# Patient Record
Sex: Female | Born: 1980 | Race: White | Hispanic: No | Marital: Single | Smoking: Never smoker
Health system: Southern US, Community
[De-identification: ages and names within clinical notes are randomized; demographics above are authoritative.]

## PROBLEM LIST (undated history)

## (undated) DIAGNOSIS — O24419 Gestational diabetes mellitus in pregnancy, unspecified control: Secondary | ICD-10-CM

## (undated) HISTORY — PX: BREAST REDUCTION SURGERY: SHX8

## (undated) HISTORY — PX: HAND SURGERY: SHX662

## (undated) HISTORY — PX: KNEE SURGERY: SHX244

---

## 2014-07-31 DIAGNOSIS — Z3009 Encounter for other general counseling and advice on contraception: Secondary | ICD-10-CM | POA: Diagnosis not present

## 2014-07-31 DIAGNOSIS — Z124 Encounter for screening for malignant neoplasm of cervix: Secondary | ICD-10-CM | POA: Diagnosis not present

## 2014-07-31 DIAGNOSIS — Z01419 Encounter for gynecological examination (general) (routine) without abnormal findings: Secondary | ICD-10-CM | POA: Diagnosis not present

## 2014-07-31 DIAGNOSIS — Z3041 Encounter for surveillance of contraceptive pills: Secondary | ICD-10-CM | POA: Diagnosis not present

## 2015-01-13 DIAGNOSIS — Z3009 Encounter for other general counseling and advice on contraception: Secondary | ICD-10-CM | POA: Diagnosis not present

## 2015-01-13 DIAGNOSIS — Z3041 Encounter for surveillance of contraceptive pills: Secondary | ICD-10-CM | POA: Diagnosis not present

## 2015-01-13 DIAGNOSIS — Z124 Encounter for screening for malignant neoplasm of cervix: Secondary | ICD-10-CM | POA: Diagnosis not present

## 2015-01-13 DIAGNOSIS — Z01419 Encounter for gynecological examination (general) (routine) without abnormal findings: Secondary | ICD-10-CM | POA: Diagnosis not present

## 2015-04-27 DIAGNOSIS — N390 Urinary tract infection, site not specified: Secondary | ICD-10-CM | POA: Diagnosis not present

## 2015-09-30 DIAGNOSIS — Z01419 Encounter for gynecological examination (general) (routine) without abnormal findings: Secondary | ICD-10-CM | POA: Diagnosis not present

## 2015-09-30 DIAGNOSIS — Z124 Encounter for screening for malignant neoplasm of cervix: Secondary | ICD-10-CM | POA: Diagnosis not present

## 2016-05-17 NOTE — L&D Delivery Note (Signed)
Delivery Note 36 y.o G4P4004 at 7314w4d with gDM induced with Cytotec, FB, and pitocin for preeclampsia on MgSO4.   At 5:11 PM a viable female was delivered via Vaginal, Spontaneous (Presentation: vertex, OA with rotation to LOA).  APGAR: 7, 9; weight: pending  .   Placenta status: intact, schultz Cord: 3 vessels  with the following complications: none .  Cord pH: n/a  Anesthesia:  Epidural Episiotomy: None Lacerations: None Suture Repair: n/a Est. Blood Loss (mL):  150mL   Mom to postpartum.  Baby to Couplet care / Skin to Skin.  Felisa BonierBianca Seivley PA Student 05/03/2017, 5:30 PM   I attest that I was present and gloved at the bedside and I agree with the above documentation and findings.   Please schedule this patient for Postpartum visit in: 1 week with the following provider: RN and in 4-6 weeks for a PP visit. Patient needs two hour GTT at her pp visit.  For C/S patients schedule nurse incision check in weeks 2 weeks: no High risk pregnancy complicated by: GDM and pre-e with severe features.  Delivery mode:  SD Anticipated Birth Control:  IUD PP Procedures needed: 2 hour GTT  Schedule Integrated BH visit: no

## 2016-08-11 ENCOUNTER — Emergency Department (INDEPENDENT_AMBULATORY_CARE_PROVIDER_SITE_OTHER)
Admission: EM | Admit: 2016-08-11 | Discharge: 2016-08-11 | Disposition: A | Discharge status: Home / Self Care | Payer: Medicare Other | Source: Home / Self Care | Attending: Family Medicine | Admitting: Family Medicine

## 2016-08-11 ENCOUNTER — Encounter: Payer: Self-pay | Admitting: Emergency Medicine

## 2016-08-11 DIAGNOSIS — L03115 Cellulitis of right lower limb: Secondary | ICD-10-CM | POA: Diagnosis not present

## 2016-08-11 MED ORDER — CLINDAMYCIN HCL 300 MG PO CAPS: 300.0000 mg | ORAL_CAPSULE | Freq: Four times a day (QID) | ORAL | 0 refills | Status: DC

## 2016-08-11 MED ORDER — HYDROCODONE-ACETAMINOPHEN 5-325 MG PO TABS: 1.0000 | ORAL_TABLET | Freq: Four times a day (QID) | ORAL | 0 refills | Status: DC | PRN

## 2016-08-11 NOTE — Discharge Instructions (Signed)
°  Norco/Vicodin (hydrocodone-acetaminophen) is a narcotic pain medication, do not combine these medications with others containing tylenol. While taking, do not drink alcohol, drive, or perform any other activities that requires focus while taking these medications.   You may continue to take ibuprofen as well to help with fever and pain.  You should use a warm damp wash cloth or allow warm water in a shower or bath run over the area of pain and redness to help encourage area to come to a head and drain. If it continues to swell and does not drain on its own, you may need to return to have it cut open to help drain and help it heal faster.

## 2016-08-11 NOTE — ED Provider Notes (Signed)
CSN: [id number]     Arrival date & time 08/11/16  1932 History   First MD Initiated Contact with Patient 08/11/16 1948     Chief Complaint  Patient presents with  . Cellulitis   (Consider location/radiation/quality/duration/timing/severity/associated sxs/prior Treatment) HPI  Ana Ellis is a 36 y.o. female presenting to UC with c/o sudden onset Right inner thigh pain and redness that started yesterday. Pain is aching and sore, 6/10, no relief with ibuprofen. Today she developed nausea and a fever of 101*F.  She took Motrin at [employer] today.  Denies prior hx of abscesses.  No known insect bite or injury to her thigh.    History reviewed. No pertinent past medical history. Past Surgical History:  Procedure Laterality Date  . KNEE SURGERY     History reviewed. No pertinent family history. Social History  Substance Use Topics  . Smoking status: Never Smoker  . Smokeless tobacco: Never Used  . Alcohol use No   OB History    No data available     Review of Systems  Constitutional: Positive for fever. Negative for chills.  HENT: Negative for congestion and sore throat.   Respiratory: Negative for cough, shortness of breath and wheezing.   Cardiovascular: Negative for chest pain and palpitations.  Gastrointestinal: Negative for diarrhea, nausea and vomiting.  Musculoskeletal: Positive for myalgias. Negative for arthralgias.  Skin: Positive for color change, rash and wound. Negative for pallor.  Neurological: Negative for dizziness, light-headedness and headaches.    Allergies  Penicillins  Home Medications   Prior to Admission medications   Medication Sig Start Date End Date Taking? Authorizing Provider  clindamycin (CLEOCIN) 300 MG capsule Take 1 capsule (300 mg total) by mouth 4 (four) times daily. X 7 days 08/11/16   Junius Finner, PA-C  HYDROcodone-acetaminophen (NORCO/VICODIN) 5-325 MG tablet Take 1-2 tablets by mouth every 6 (six) hours as needed for severe pain. 08/11/16    Junius Finner, PA-C   Meds Ordered and Administered this Visit  Medications - No data to display  BP (!) 169/77 (BP Location: Right Arm)   Pulse 96   Temp 99.6 F (37.6 C) (Oral)   Wt 252 lb (114.3 kg)   LMP 07/31/2016 (Approximate)   SpO2 96%  No data found.   Physical Exam  Constitutional: She is oriented to person, place, and time. She appears well-developed and well-nourished. No distress.  HENT:  Head: Normocephalic and atraumatic.  Mouth/Throat: Oropharynx is clear and moist.  Eyes: EOM are normal.  Neck: Normal range of motion.  Cardiovascular: Normal rate.   Pulmonary/Chest: Effort normal. No respiratory distress.  Musculoskeletal: Normal range of motion.  Neurological: She is alert and oriented to person, place, and time.  Skin: Skin is warm and dry. She is not diaphoretic. There is erythema.  Right upper medial thigh: 3cm area of erythema with 1cm area of induration and tenderness. Centralized superficial sore in skin. No active bleeding or drainage. No fluctuance.   Psychiatric: She has a normal mood and affect. Her behavior is normal.  Nursing note and vitals reviewed.   Urgent Care Course     Procedures (including critical care time)  Labs Review Labs Reviewed - No data to display  Imaging Review No results found.   MDM   1. Cellulitis of right thigh    Cellulitis of Right medial thigh. There is induration on exam but no fluctuance. I&D would not be of benefit at this time.  Rx: Clindamycin and norco for pain in evening.  Encouraged warm compresses.  f/u with PCP or return to UC in 4-5 days if not improving, sooner if worsening as I&D may need to be performed.     Junius Finnerrin O'Malley, PA-C 08/11/16 2005

## 2016-08-11 NOTE — ED Triage Notes (Signed)
Pt c/o bump on inner right thigh she noticed yesterday. Fever of 101 today. She took motrin at 6pm.

## 2016-08-14 ENCOUNTER — Telehealth: Payer: Self-pay | Admitting: Emergency Medicine

## 2016-08-14 NOTE — Telephone Encounter (Signed)
Left message advising if pt is doing well to disregard call, any questions or concerns, feel free to give [place] a a call back.  TMartin,CMA

## 2016-09-30 ENCOUNTER — Encounter (INDEPENDENT_AMBULATORY_CARE_PROVIDER_SITE_OTHER): Payer: Self-pay

## 2016-09-30 ENCOUNTER — Ambulatory Visit (INDEPENDENT_AMBULATORY_CARE_PROVIDER_SITE_OTHER): Payer: Medicare Other | Admitting: Obstetrics & Gynecology

## 2016-09-30 ENCOUNTER — Other Ambulatory Visit (HOSPITAL_COMMUNITY)
Admission: RE | Admit: 2016-09-30 | Discharge: 2016-09-30 | Disposition: A | Discharge status: Home / Self Care | Payer: Medicare Other | Source: Ambulatory Visit | Attending: Obstetrics & Gynecology | Admitting: Obstetrics & Gynecology

## 2016-09-30 ENCOUNTER — Encounter: Payer: Self-pay | Admitting: Obstetrics & Gynecology

## 2016-09-30 VITALS — BP 131/79 | HR 103 | Ht 61.0 in | Wt 247.0 lb

## 2016-09-30 DIAGNOSIS — Z6841 Body Mass Index (BMI) 40.0 and over, adult: Secondary | ICD-10-CM | POA: Diagnosis not present

## 2016-09-30 DIAGNOSIS — O09521 Supervision of elderly multigravida, first trimester: Secondary | ICD-10-CM

## 2016-09-30 DIAGNOSIS — O99210 Obesity complicating pregnancy, unspecified trimester: Secondary | ICD-10-CM

## 2016-09-30 DIAGNOSIS — O0990 Supervision of high risk pregnancy, unspecified, unspecified trimester: Secondary | ICD-10-CM | POA: Insufficient documentation

## 2016-09-30 DIAGNOSIS — Z3A09 9 weeks gestation of pregnancy: Secondary | ICD-10-CM | POA: Insufficient documentation

## 2016-09-30 DIAGNOSIS — O09529 Supervision of elderly multigravida, unspecified trimester: Secondary | ICD-10-CM | POA: Insufficient documentation

## 2016-09-30 DIAGNOSIS — O09891 Supervision of other high risk pregnancies, first trimester: Secondary | ICD-10-CM

## 2016-09-30 NOTE — Progress Notes (Signed)
Bedside ultrasound: CRL 12.2 mm consistent with CRL of 8 week 4 days. Fetal heart rate 168 bom. Armandina StammerJennifer Zaine Elsass RNBSN

## 2016-09-30 NOTE — Progress Notes (Signed)
   Subjective:    Patient ID: Ana Ellis, female    DOB: 11-11-1980, 36 y.o.   MRN: [id number]  HPI    Review of Systems     Objective:   Physical Exam        Assessment & Plan:    Subjective:    Ana Ellis is a [id number] 8 weeks4 days being seen today for her first obstetrical visit.  Her obstetrical history is significant for advanced maternal age and obesity. Patient does intend to breast feed. Pregnancy history fully reviewed.  Patient reports no complaints.  Vitals:   09/30/16 1409 09/30/16 1410  BP: 131/79   Pulse: (!) 103   Weight: 247 lb (112 kg)   Height:  5\' 1"  (1.549 m)    HISTORY: OB History  Gravida Para Term Preterm AB Living  5 4 4     4   SAB TAB Ectopic Multiple Live Births               # Outcome Date GA Lbr Len/2nd Weight Sex Delivery Anes PTL Lv  5 Current           4 Term           3 Term           2 Term           1 Term              History reviewed. No pertinent past medical history. Past Surgical History:  Procedure Laterality Date  . BREAST REDUCTION SURGERY    . HAND SURGERY    . KNEE SURGERY     History reviewed. No pertinent family history.   Exam    Uterus:     Pelvic Exam:    Perineum: No Hemorrhoids   Vulva: normal   Vagina:  normal mucosa   pH:    Cervix: anteverted   Adnexa: normal adnexa   Bony Pelvis: android  System: Breast:  normal appearance, no masses or tenderness   Skin: normal coloration and turgor, no rashes    Neurologic: oriented   Extremities: normal strength, tone, and muscle mass   HEENT PERRLA   Mouth/Teeth mucous membranes moist, pharynx normal without lesions   Neck supple   Cardiovascular: regular rate and rhythm   Respiratory:  appears well, vitals normal, no respiratory distress, acyanotic, normal RR, ear and throat exam is normal, neck free of mass or lymphadenopathy, chest clear, no wheezing, crepitations, rhonchi, normal symmetric air entry   Abdomen: soft, non-tender; bowel  sounds normal; no masses,  no organomegaly   Urinary: urethral meatus normal      Assessment:    Pregnancy: [id number] Patient Active Problem List   Diagnosis Date Noted  . Supervision of other high risk pregnancies, first trimester 09/30/2016  . Obesity in pregnancy 09/30/2016  . AMA (advanced maternal age) multigravida 35+ 09/30/2016        Plan:     Initial labs drawn. Prenatal vitamins. Problem list reviewed and updated. Genetic Screening discussed and she declines genetic testing.  Ultrasound discussed; fetal survey: requested.  Follow up in 4 weeks.  Allie BossierMyra C Garth Diffley 09/30/2016

## 2016-10-01 LAB — PRENATAL PROFILE (SOLSTAS)
ANTIBODY SCREEN: NEGATIVE
Basophils Absolute: 0 cells/uL (ref 0–200)
Basophils Relative: 0 %
Eosinophils Absolute: 0 cells/uL — ABNORMAL LOW (ref 15–500)
Eosinophils Relative: 0 %
HCT: 40.3 % (ref 35.0–45.0)
HIV: NONREACTIVE
Hemoglobin: 13 g/dL (ref 11.7–15.5)
Hepatitis B Surface Ag: NEGATIVE
LYMPHS PCT: 19 %
Lymphs Abs: 1539 cells/uL (ref 850–3900)
MCH: 27.5 pg (ref 27.0–33.0)
MCHC: 32.3 g/dL (ref 32.0–36.0)
MCV: 85.2 fL (ref 80.0–100.0)
MONO ABS: 324 {cells}/uL (ref 200–950)
MPV: 9.3 fL (ref 7.5–12.5)
Monocytes Relative: 4 %
NEUTROS PCT: 77 %
Neutro Abs: 6237 cells/uL (ref 1500–7800)
PLATELETS: 275 10*3/uL (ref 140–400)
RBC: 4.73 MIL/uL (ref 3.80–5.10)
RDW: 14.1 % (ref 11.0–15.0)
RH TYPE: POSITIVE
Rubella: 2.48 Index — ABNORMAL HIGH (ref <0.90)
WBC: 8.1 10*3/uL (ref 3.8–10.8)

## 2016-10-01 LAB — GC/CHLAMYDIA PROBE AMP (~~LOC~~) NOT AT ARMC
CHLAMYDIA, DNA PROBE: NEGATIVE
NEISSERIA GONORRHEA: NEGATIVE

## 2016-10-01 LAB — HEMOGLOBIN A1C
HEMOGLOBIN A1C: 4.8 % (ref <5.7)
Mean Plasma Glucose: 91 mg/dL

## 2016-10-28 ENCOUNTER — Ambulatory Visit (INDEPENDENT_AMBULATORY_CARE_PROVIDER_SITE_OTHER): Payer: Medicaid Other | Admitting: Obstetrics & Gynecology

## 2016-10-28 VITALS — BP 130/77 | HR 105 | Wt 245.0 lb

## 2016-10-28 DIAGNOSIS — O99212 Obesity complicating pregnancy, second trimester: Secondary | ICD-10-CM

## 2016-10-28 DIAGNOSIS — O09522 Supervision of elderly multigravida, second trimester: Secondary | ICD-10-CM

## 2016-10-28 DIAGNOSIS — O09891 Supervision of other high risk pregnancies, first trimester: Secondary | ICD-10-CM

## 2016-10-28 DIAGNOSIS — O09892 Supervision of other high risk pregnancies, second trimester: Secondary | ICD-10-CM

## 2016-10-28 DIAGNOSIS — O99210 Obesity complicating pregnancy, unspecified trimester: Secondary | ICD-10-CM

## 2016-10-28 NOTE — Progress Notes (Signed)
   PRENATAL VISIT NOTE  Subjective:  Ana Ellis is a 36 y.o. [id number] at 7259w2d being seen today for ongoing prenatal care.  She is currently monitored for the following issues for this high-risk pregnancy and has Supervision of other high risk pregnancies, first trimester; Obesity in pregnancy; and AMA (advanced maternal age) multigravida 35+ on her problem list.  Patient reports no complaints.  Contractions: Not present. Vag. Bleeding: None.  Movement: Absent. Denies leaking of fluid.   The following portions of the patient's history were reviewed and updated as appropriate: allergies, current medications, past family history, past medical history, past social history, past surgical history and problem list. Problem list updated.  Objective:   Vitals:   10/28/16 1459  BP: 130/77  Pulse: (!) 105  Weight: 245 lb (111.1 kg)    Fetal Status: Fetal Heart Rate (bpm): 162   Movement: Absent     General:  Alert, oriented and cooperative. Patient is in no acute distress.  Skin: Skin is warm and dry. No rash noted.   Cardiovascular: Normal heart rate noted  Respiratory: Normal respiratory effort, no problems with respiration noted  Abdomen: Soft, gravid, appropriate for gestational age. Pain/Pressure: Absent     Pelvic:  Cervical exam deferred        Extremities: Normal range of motion.  Edema: None  Mental Status: Normal mood and affect. Normal behavior. Normal judgment and thought content.   Assessment and Plan:  Pregnancy: G5P4004 at 1659w2d  1. Elderly multigravida in second trimester - declines genetic testing  2. Obesity in pregnancy - She has lost some weight, no intentional, just eating when she is hungry and has restarted swimming. No nausea/vomitting  3. Supervision of other high risk pregnancies, first trimester   Preterm labor symptoms and general obstetric precautions including but not limited to vaginal bleeding, contractions, leaking of fluid and fetal movement were  reviewed in detail with the patient. Please refer to After Visit Summary for other counseling recommendations.  No Follow-up on file.   Allie BossierMyra C Modena Bellemare, MD

## 2016-11-25 ENCOUNTER — Encounter: Payer: Medicare Other | Admitting: Obstetrics & Gynecology

## 2016-11-25 ENCOUNTER — Ambulatory Visit (INDEPENDENT_AMBULATORY_CARE_PROVIDER_SITE_OTHER): Payer: Medicare Other | Admitting: Obstetrics & Gynecology

## 2016-11-25 VITALS — BP 110/57 | HR 106 | Wt 247.0 lb

## 2016-11-25 DIAGNOSIS — O09891 Supervision of other high risk pregnancies, first trimester: Secondary | ICD-10-CM

## 2016-11-25 DIAGNOSIS — O99210 Obesity complicating pregnancy, unspecified trimester: Secondary | ICD-10-CM | POA: Diagnosis not present

## 2016-11-25 DIAGNOSIS — O99212 Obesity complicating pregnancy, second trimester: Secondary | ICD-10-CM

## 2016-11-25 DIAGNOSIS — E669 Obesity, unspecified: Secondary | ICD-10-CM | POA: Diagnosis not present

## 2016-11-25 DIAGNOSIS — O30009 Twin pregnancy, unspecified number of placenta and unspecified number of amniotic sacs, unspecified trimester: Secondary | ICD-10-CM

## 2016-11-25 DIAGNOSIS — Z3689 Encounter for other specified antenatal screening: Principal | ICD-10-CM

## 2016-11-25 DIAGNOSIS — O09892 Supervision of other high risk pregnancies, second trimester: Secondary | ICD-10-CM

## 2016-11-25 NOTE — Addendum Note (Signed)
Addended by: Granville LewisLARK, Dakoda Bassette L on: 11/25/2016 01:12 PM   Modules accepted: Orders

## 2016-11-25 NOTE — Addendum Note (Signed)
Addended by: Lesly DukesLEGGETT, Dontavis Tschantz H on: 11/25/2016 12:46 PM   Modules accepted: Orders

## 2016-11-25 NOTE — Progress Notes (Signed)
Needs to schedule early GTT    PRENATAL VISIT NOTE  Subjective:  Ana Ellis is a 36 y.o. [id number] at 4056w2d being seen today for ongoing prenatal care.  She is currently monitored for the following issues for this high-risk pregnancy and has Supervision of other high risk pregnancies, first trimester; Obesity in pregnancy; and AMA (advanced maternal age) multigravida 35+ on her problem list.  Patient reports no complaints.  Contractions: Not present. Vag. Bleeding: None.  Movement: Absent. Denies leaking of fluid.   The following portions of the patient's history were reviewed and updated as appropriate: allergies, current medications, past family history, past medical history, past social history, past surgical history and problem list. Problem list updated.  Objective:   Vitals:   11/25/16 1151  BP: (!) 110/57  Pulse: (!) 106  Weight: 247 lb (112 kg)    Fetal Status: Fetal Heart Rate (bpm): 156   Movement: Absent     General:  Alert, oriented and cooperative. Patient is in no acute distress.  Skin: Skin is warm and dry. No rash noted.   Cardiovascular: Normal heart rate noted  Respiratory: Normal respiratory effort, no problems with respiration noted  Abdomen: Soft, gravid, appropriate for gestational age. Pain/Pressure: Absent     Pelvic:  Cervical exam deferred        Extremities: Normal range of motion.  Edema: None  Mental Status: Normal mood and affect. Normal behavior. Normal judgment and thought content.   Assessment and Plan:  Pregnancy: G5P4004 at 5556w2d  1. Encounter for ultrasound to assess fetal anatomy and growth in twin pregnancy, antepartum -Increased BMI--early GCT - US MFM OB DETAIL +14 WK; Future - Declines genetic screening  Preterm labor symptoms and general obstetric precautions including but not limited to vaginal bleeding, contractions, leaking of fluid and fetal movement were reviewed in detail with the patient. Please refer to After Visit Summary  for other counseling recommendations.  Return in about 4 weeks (around 12/23/2016).   Elsie LincolnKelly Valisha Heslin, MD

## 2016-11-26 LAB — GLUCOSE TOLERANCE, 1 HOUR (50G) W/O FASTING: GLUCOSE, 1 HR, GESTATIONAL: 130 mg/dL (ref <140)

## 2016-12-14 ENCOUNTER — Other Ambulatory Visit: Payer: Self-pay | Admitting: Obstetrics & Gynecology

## 2016-12-14 ENCOUNTER — Ambulatory Visit (HOSPITAL_COMMUNITY)
Admission: RE | Admit: 2016-12-14 | Discharge: 2016-12-14 | Disposition: A | Discharge status: Home / Self Care | Payer: Medicare Other | Source: Ambulatory Visit | Attending: Obstetrics & Gynecology | Admitting: Obstetrics & Gynecology

## 2016-12-14 DIAGNOSIS — Z3A18 18 weeks gestation of pregnancy: Secondary | ICD-10-CM

## 2016-12-14 DIAGNOSIS — O09522 Supervision of elderly multigravida, second trimester: Secondary | ICD-10-CM

## 2016-12-14 DIAGNOSIS — O99212 Obesity complicating pregnancy, second trimester: Secondary | ICD-10-CM

## 2016-12-14 DIAGNOSIS — O09891 Supervision of other high risk pregnancies, first trimester: Secondary | ICD-10-CM

## 2016-12-14 DIAGNOSIS — Z3689 Encounter for other specified antenatal screening: Secondary | ICD-10-CM

## 2016-12-23 ENCOUNTER — Ambulatory Visit (INDEPENDENT_AMBULATORY_CARE_PROVIDER_SITE_OTHER): Payer: Medicare Other | Admitting: Obstetrics and Gynecology

## 2016-12-23 VITALS — BP 128/76 | HR 95 | Wt 251.0 lb

## 2016-12-23 DIAGNOSIS — O09522 Supervision of elderly multigravida, second trimester: Secondary | ICD-10-CM

## 2016-12-23 DIAGNOSIS — O09892 Supervision of other high risk pregnancies, second trimester: Secondary | ICD-10-CM

## 2016-12-23 DIAGNOSIS — O09891 Supervision of other high risk pregnancies, first trimester: Secondary | ICD-10-CM

## 2016-12-23 DIAGNOSIS — O99210 Obesity complicating pregnancy, unspecified trimester: Secondary | ICD-10-CM

## 2016-12-23 DIAGNOSIS — O99212 Obesity complicating pregnancy, second trimester: Secondary | ICD-10-CM

## 2016-12-23 NOTE — Progress Notes (Signed)
   PRENATAL VISIT NOTE  Subjective:  Ana Ellis is a 36 y.o. [id number] at 5185w6d being seen today for ongoing prenatal care.  She is currently monitored for the following issues for this high-risk pregnancy and has Supervision of other high risk pregnancies, first trimester; Obesity in pregnancy; and AMA (advanced maternal age) multigravida 35+ on her problem list.  Patient reports no complaints.  Contractions: Not present. Vag. Bleeding: None.  Movement: Present. Denies leaking of fluid.   The following portions of the patient's history were reviewed and updated as appropriate: allergies, current medications, past family history, past medical history, past social history, past surgical history and problem list. Problem list updated.  Objective:   Vitals:   12/23/16 1538  BP: 128/76  Pulse: 95  Weight: 251 lb (113.9 kg)    Fetal Status: Fetal Heart Rate (bpm): 135   Movement: Present     General:  Alert, oriented and cooperative. Patient is in no acute distress.  Skin: Skin is warm and dry. No rash noted.   Cardiovascular: Normal heart rate noted  Respiratory: Normal respiratory effort, no problems with respiration noted  Abdomen: Soft, gravid, appropriate for gestational age.  Pain/Pressure: Absent     Pelvic: Cervical exam deferred        Extremities: Normal range of motion.  Edema: None  Mental Status:  Normal mood and affect. Normal behavior. Normal judgment and thought content.   Assessment and Plan:  Pregnancy: G5P4004 at 7585w6d  1. Supervision of other high risk pregnancies, first trimester Patient is doing well without complaints Ultrasound results reviewed with the patient  2. Elderly multigravida in second trimester Declined testing  3. Obesity in pregnancy Good weight   Preterm labor symptoms and general obstetric precautions including but not limited to vaginal bleeding, contractions, leaking of fluid and fetal movement were reviewed in detail with the  patient. Please refer to After Visit Summary for other counseling recommendations.  Return in about 4 weeks (around 01/20/2017) for ROB.   Catalina AntiguaPeggy Laloni Rowton, MD

## 2016-12-29 ENCOUNTER — Other Ambulatory Visit (INDEPENDENT_AMBULATORY_CARE_PROVIDER_SITE_OTHER): Payer: Medicare Other | Admitting: *Deleted

## 2016-12-29 DIAGNOSIS — R309 Painful micturition, unspecified: Secondary | ICD-10-CM

## 2016-12-29 LAB — POCT URINALYSIS DIPSTICK
BILIRUBIN UA: NEGATIVE
Glucose, UA: NEGATIVE
KETONES UA: NEGATIVE
Nitrite, UA: NEGATIVE
PROTEIN UA: NEGATIVE
SPEC GRAV UA: 1.015 (ref 1.010–1.025)
Urobilinogen, UA: NEGATIVE E.U./dL — AB
pH, UA: 7 (ref 5.0–8.0)

## 2016-12-29 MED ORDER — NITROFURANTOIN MONOHYD MACRO 100 MG PO CAPS: 100.0000 mg | ORAL_CAPSULE | Freq: Two times a day (BID) | ORAL | 0 refills | Status: DC

## 2016-12-29 NOTE — Progress Notes (Signed)
Pt here with c/o"s urinary pain and frequency since yesterday. Urinalysis is positive and urine culture was sent. RX was sent to [facility] for Macrobid BID for 7 days per protocol.

## 2016-12-30 ENCOUNTER — Encounter: Payer: Self-pay | Admitting: Obstetrics & Gynecology

## 2016-12-30 LAB — URINE CULTURE

## 2017-01-05 ENCOUNTER — Telehealth: Payer: Self-pay | Admitting: *Deleted

## 2017-01-05 NOTE — Telephone Encounter (Signed)
-----   Message from Kathee Delton, RN sent at 01/05/2017 11:42 AM EDT -----   ----- Message ----- From: Lesly Dukes, MD Sent: 12/30/2016   4:20 PM To: [facility]  Can you call patient and see what reaction is to PCN?

## 2017-01-05 NOTE — Telephone Encounter (Signed)
LM on voicemail to ask what her reaction is to PCN.

## 2017-01-06 ENCOUNTER — Other Ambulatory Visit: Payer: Self-pay | Admitting: Obstetrics & Gynecology

## 2017-01-11 ENCOUNTER — Other Ambulatory Visit: Payer: Medicare Other | Admitting: *Deleted

## 2017-01-11 ENCOUNTER — Other Ambulatory Visit (HOSPITAL_COMMUNITY)
Admission: RE | Admit: 2017-01-11 | Discharge: 2017-01-11 | Disposition: A | Discharge status: Home / Self Care | Payer: Medicare Other | Source: Ambulatory Visit | Attending: Obstetrics & Gynecology | Admitting: Obstetrics & Gynecology

## 2017-01-11 DIAGNOSIS — B373 Candidiasis of vulva and vagina: Secondary | ICD-10-CM | POA: Diagnosis not present

## 2017-01-11 DIAGNOSIS — R309 Painful micturition, unspecified: Secondary | ICD-10-CM | POA: Insufficient documentation

## 2017-01-11 NOTE — Progress Notes (Signed)
Pt here with c/o vaginal pain and frequency and vaginal itching.  Urine culture sent and Aptima swab sent for Yeast and BV

## 2017-01-13 ENCOUNTER — Telehealth: Payer: Self-pay | Admitting: *Deleted

## 2017-01-13 ENCOUNTER — Encounter: Payer: Self-pay | Admitting: Obstetrics & Gynecology

## 2017-01-13 DIAGNOSIS — B373 Candidiasis of vulva and vagina: Secondary | ICD-10-CM

## 2017-01-13 DIAGNOSIS — B951 Streptococcus, group B, as the cause of diseases classified elsewhere: Secondary | ICD-10-CM | POA: Insufficient documentation

## 2017-01-13 DIAGNOSIS — N390 Urinary tract infection, site not specified: Secondary | ICD-10-CM

## 2017-01-13 DIAGNOSIS — B3731 Acute candidiasis of vulva and vagina: Secondary | ICD-10-CM

## 2017-01-13 DIAGNOSIS — O2340 Unspecified infection of urinary tract in pregnancy, unspecified trimester: Secondary | ICD-10-CM

## 2017-01-13 LAB — CERVICOVAGINAL ANCILLARY ONLY
Bacterial vaginitis: NEGATIVE
Candida vaginitis: POSITIVE — AB

## 2017-01-13 LAB — CULTURE, URINE COMPREHENSIVE

## 2017-01-13 MED ORDER — TERCONAZOLE 0.4 % VA CREA: 1.0000 | TOPICAL_CREAM | Freq: Every day | VAGINAL | 0 refills | Status: DC

## 2017-01-13 MED ORDER — SULFAMETHOXAZOLE-TRIMETHOPRIM 800-160 MG PO TABS: 1.0000 | ORAL_TABLET | Freq: Two times a day (BID) | ORAL | 0 refills | Status: DC

## 2017-01-13 NOTE — Telephone Encounter (Signed)
Pt urine culture positive for GBS yet pt is allergic to PCN.  Dr Marice Potterove sent in Bactrim DS to cover the UTI even though it isn't the best line of defense.  Pt is positive for Candida and Terazol 7 was snet to her pharmacy also.

## 2017-01-20 ENCOUNTER — Ambulatory Visit (INDEPENDENT_AMBULATORY_CARE_PROVIDER_SITE_OTHER): Payer: Medicare Other | Admitting: Obstetrics & Gynecology

## 2017-01-20 DIAGNOSIS — O09891 Supervision of other high risk pregnancies, first trimester: Secondary | ICD-10-CM

## 2017-01-20 NOTE — Progress Notes (Signed)
.     PRENATAL VISIT NOTE  Subjective:  Ana Ellis is a 36 y.o. G5P4004 at 1438w6d being seen today for ongoing prenatal care.  She is currently monitored for the following issues for this high-risk pregnancy and has Supervision of other high risk pregnancies, first trimester; Obesity in pregnancy; AMA (advanced maternal age) multigravida 35+; GBS bacteriuria; and GBS (group B streptococcus) UTI complicating pregnancy on her problem list.  Patient reports no complaints.  Contractions: Irritability. Vag. Bleeding: None.  Movement: Present. Denies leaking of fluid.   The following portions of the patient's history were reviewed and updated as appropriate: allergies, current medications, past family history, past medical history, past social history, past surgical history and problem list. Problem list updated.  Objective:   Vitals:   01/20/17 1543  BP: 131/78  Pulse: (!) 110  Weight: 253 lb (114.8 kg)    Fetal Status: Fetal Heart Rate (bpm): 145 Fundal Height: 28 cm Movement: Present     General:  Alert, oriented and cooperative. Patient is in no acute distress.  Skin: Skin is warm and dry. No rash noted.   Cardiovascular: Normal heart rate noted  Respiratory: Normal respiratory effort, no problems with respiration noted  Abdomen: Soft, gravid, appropriate for gestational age.  Pain/Pressure: Absent     Pelvic: Cervical exam deferred        Extremities: Normal range of motion.  Edema: None  Mental Status:  Normal mood and affect. Normal behavior. Normal judgment and thought content.   Assessment and Plan:  Pregnancy: G5P4004 at 6238w6d  1. Supervision of other high risk pregnancies, first trimester Will get signed up for babyscripts app only.  Desires to read Discussed GBS treatment in labor 28 labs next week  Preterm labor symptoms and general obstetric precautions including but not limited to vaginal bleeding, contractions, leaking of fluid and fetal movement were reviewed in  detail with the patient. Please refer to After Visit Summary for other counseling recommendations.  Return in about 4 weeks (around 02/17/2017).   Elsie LincolnKelly Xavyer Steenson, MD

## 2017-01-25 ENCOUNTER — Other Ambulatory Visit (HOSPITAL_COMMUNITY)
Admission: RE | Admit: 2017-01-25 | Discharge: 2017-01-25 | Disposition: A | Discharge status: Home / Self Care | Payer: Medicare Other | Source: Ambulatory Visit | Attending: Obstetrics and Gynecology | Admitting: Obstetrics and Gynecology

## 2017-01-25 ENCOUNTER — Other Ambulatory Visit: Payer: Medicare Other

## 2017-01-25 DIAGNOSIS — R300 Dysuria: Secondary | ICD-10-CM

## 2017-01-25 DIAGNOSIS — N949 Unspecified condition associated with female genital organs and menstrual cycle: Secondary | ICD-10-CM

## 2017-01-25 NOTE — Progress Notes (Signed)
Agree with nursing staff's documentation of this patient's clinic encounter.  Davier Tramell, MD    

## 2017-01-25 NOTE — Progress Notes (Signed)
Patient presents for urine culture due to dysuria. Also complaining of vaginal burning after intercourse. Patient collected urine culture and self swab. Armandina StammerJennifer Howard RNBSN

## 2017-01-26 LAB — GC/CHLAMYDIA PROBE AMP (~~LOC~~) NOT AT ARMC
Chlamydia: NEGATIVE
Neisseria Gonorrhea: NEGATIVE

## 2017-01-27 ENCOUNTER — Other Ambulatory Visit: Payer: Self-pay | Admitting: Obstetrics and Gynecology

## 2017-01-27 LAB — URINE CULTURE
MICRO NUMBER: 80999726
SPECIMEN QUALITY:: ADEQUATE

## 2017-01-27 MED ORDER — NITROFURANTOIN MONOHYD MACRO 100 MG PO CAPS: 100.0000 mg | ORAL_CAPSULE | Freq: Two times a day (BID) | ORAL | 0 refills | Status: DC

## 2017-01-31 ENCOUNTER — Telehealth: Payer: Self-pay | Admitting: *Deleted

## 2017-01-31 NOTE — Telephone Encounter (Signed)
Pt notified of UTI and appropriate RX [facility] Dr Jolayne Panther

## 2017-01-31 NOTE — Telephone Encounter (Signed)
-----   Message from Catalina Antigua, MD sent at 01/27/2017  1:42 PM EDT ----- Please inform patient of UTI. Rx has been e-prescribed  Animator

## 2017-02-17 ENCOUNTER — Encounter: Payer: Medicare Other | Admitting: Obstetrics & Gynecology

## 2017-02-18 ENCOUNTER — Encounter: Payer: Medicare Other | Admitting: Advanced Practice Midwife

## 2017-02-25 ENCOUNTER — Encounter: Payer: Medicare Other | Admitting: Obstetrics and Gynecology

## 2017-03-07 ENCOUNTER — Encounter: Payer: Self-pay | Admitting: Advanced Practice Midwife

## 2017-03-07 ENCOUNTER — Ambulatory Visit (INDEPENDENT_AMBULATORY_CARE_PROVIDER_SITE_OTHER): Payer: Medicare Other | Admitting: Advanced Practice Midwife

## 2017-03-07 VITALS — BP 113/67 | HR 103 | Wt 257.0 lb

## 2017-03-07 DIAGNOSIS — Z3483 Encounter for supervision of other normal pregnancy, third trimester: Secondary | ICD-10-CM

## 2017-03-07 DIAGNOSIS — Z3490 Encounter for supervision of normal pregnancy, unspecified, unspecified trimester: Secondary | ICD-10-CM | POA: Diagnosis not present

## 2017-03-07 DIAGNOSIS — Z23 Encounter for immunization: Secondary | ICD-10-CM

## 2017-03-07 NOTE — Patient Instructions (Signed)
Third Trimester of Pregnancy The third trimester is from week 28 through week 40 (months 7 through 9). The third trimester is a time when the unborn baby (fetus) is growing rapidly. At the end of the ninth month, the fetus is about 20 inches in length and weighs 6-10 pounds. Body changes during your third trimester Your body will continue to go through many changes during pregnancy. The changes vary from woman to woman. During the third trimester:  Your weight will continue to increase. You can expect to gain 25-35 pounds (11-16 kg) by the end of the pregnancy.  You may begin to get stretch marks on your hips, abdomen, and breasts.  You may urinate more often because the fetus is moving lower into your pelvis and pressing on your bladder.  You may develop or continue to have heartburn. This is caused by increased hormones that slow down muscles in the digestive tract.  You may develop or continue to have constipation because increased hormones slow digestion and cause the muscles that push waste through your intestines to relax.  You may develop hemorrhoids. These are swollen veins (varicose veins) in the rectum that can itch or be painful.  You may develop swollen, bulging veins (varicose veins) in your legs.  You may have increased body aches in the pelvis, back, or thighs. This is due to weight gain and increased hormones that are relaxing your joints.  You may have changes in your hair. These can include thickening of your hair, rapid growth, and changes in texture. Some women also have hair loss during or after pregnancy, or hair that feels dry or thin. Your hair will most likely return to normal after your baby is born.  Your breasts will continue to grow and they will continue to become tender. A yellow fluid (colostrum) may leak from your breasts. This is the first milk you are producing for your baby.  Your belly button may stick out.  You may notice more swelling in your hands,  face, or ankles.  You may have increased tingling or numbness in your hands, arms, and legs. The skin on your belly may also feel numb.  You may feel short of breath because of your expanding uterus.  You may have more problems sleeping. This can be caused by the size of your belly, increased need to urinate, and an increase in your body's metabolism.  You may notice the fetus "dropping," or moving lower in your abdomen (lightening).  You may have increased vaginal discharge.  You may notice your joints feel loose and you may have pain around your pelvic bone.  What to expect at prenatal visits You will have prenatal exams every 2 weeks until week 36. Then you will have weekly prenatal exams. During a routine prenatal visit:  You will be weighed to make sure you and the baby are growing normally.  Your blood pressure will be taken.  Your abdomen will be measured to track your baby's growth.  The fetal heartbeat will be listened to.  Any test results from the previous visit will be discussed.  You may have a cervical check near your due date to see if your cervix has softened or thinned (effaced).  You will be tested for Group B streptococcus. This happens between 35 and 37 weeks.  Your health care provider may ask you:  What your birth plan is.  How you are feeling.  If you are feeling the baby move.  If you have had   any abnormal symptoms, such as leaking fluid, bleeding, severe headaches, or abdominal cramping.  If you are using any tobacco products, including cigarettes, chewing tobacco, and electronic cigarettes.  If you have any questions.  Other tests or screenings that may be performed during your third trimester include:  Blood tests that check for low iron levels (anemia).  Fetal testing to check the health, activity level, and growth of the fetus. Testing is done if you have certain medical conditions or if there are problems during the  pregnancy.  Nonstress test (NST). This test checks the health of your baby to make sure there are no signs of problems, such as the baby not getting enough oxygen. During this test, a belt is placed around your belly. The baby is made to move, and its heart rate is monitored during movement.  What is false labor? False labor is a condition in which you feel small, irregular tightenings of the muscles in the womb (contractions) that usually go away with rest, changing position, or drinking water. These are called Braxton Hicks contractions. Contractions may last for hours, days, or even weeks before true labor sets in. If contractions come at regular intervals, become more frequent, increase in intensity, or become painful, you should see your health care provider. What are the signs of labor?  Abdominal cramps.  Regular contractions that start at 10 minutes apart and become stronger and more frequent with time.  Contractions that start on the top of the uterus and spread down to the lower abdomen and back.  Increased pelvic pressure and dull back pain.  A watery or bloody mucus discharge that comes from the vagina.  Leaking of amniotic fluid. This is also known as your "water breaking." It could be a slow trickle or a gush. Let your health care provider know if it has a color or strange odor. If you have any of these signs, call your health care provider right away, even if it is before your due date. Follow these instructions at home: Medicines  Follow your health care provider's instructions regarding medicine use. Specific medicines may be either safe or unsafe to take during pregnancy.  Take a prenatal vitamin that contains at least 600 micrograms (mcg) of folic acid.  If you develop constipation, try taking a stool softener if your health care provider approves. Eating and drinking  Eat a balanced diet that includes fresh fruits and vegetables, whole grains, good sources of protein  such as meat, eggs, or tofu, and low-fat dairy. Your health care provider will help you determine the amount of weight gain that is right for you.  Avoid raw meat and uncooked cheese. These carry germs that can cause birth defects in the baby.  If you have low calcium intake from food, talk to your health care provider about whether you should take a daily calcium supplement.  Eat four or five small meals rather than three large meals a day.  Limit foods that are high in fat and processed sugars, such as fried and sweet foods.  To prevent constipation: ? Drink enough fluid to keep your urine clear or pale yellow. ? Eat foods that are high in fiber, such as fresh fruits and vegetables, whole grains, and beans. Activity  Exercise only as directed by your health care provider. Most women can continue their usual exercise routine during pregnancy. Try to exercise for 30 minutes at least 5 days a week. Stop exercising if you experience uterine contractions.  Avoid heavy   lifting.  Do not exercise in extreme heat or humidity, or at high altitudes.  Wear low-heel, comfortable shoes.  Practice good posture.  You may continue to have sex unless your health care provider tells you otherwise. Relieving pain and discomfort  Take frequent breaks and rest with your legs elevated if you have leg cramps or low back pain.  Take warm sitz baths to soothe any pain or discomfort caused by hemorrhoids. Use hemorrhoid cream if your health care provider approves.  Wear a good support bra to prevent discomfort from breast tenderness.  If you develop varicose veins: ? Wear support pantyhose or compression stockings as told by your healthcare provider. ? Elevate your feet for 15 minutes, 3-4 times a day. Prenatal care  Write down your questions. Take them to your prenatal visits.  Keep all your prenatal visits as told by your health care provider. This is important. Safety  Wear your seat belt at  all times when driving.  Make a list of emergency phone numbers, including numbers for family, friends, the hospital, and police and fire departments. General instructions  Avoid cat litter boxes and soil used by cats. These carry germs that can cause birth defects in the baby. If you have a cat, ask someone to clean the litter box for you.  Do not travel far distances unless it is absolutely necessary and only with the approval of your health care provider.  Do not use hot tubs, steam rooms, or saunas.  Do not drink alcohol.  Do not use any products that contain nicotine or tobacco, such as cigarettes and e-cigarettes. If you need help quitting, ask your health care provider.  Do not use any medicinal herbs or unprescribed drugs. These chemicals affect the formation and growth of the baby.  Do not douche or use tampons or scented sanitary pads.  Do not cross your legs for long periods of time.  To prepare for the arrival of your baby: ? Take prenatal classes to understand, practice, and ask questions about labor and delivery. ? Make a trial run to the hospital. ? Visit the hospital and tour the maternity area. ? Arrange for maternity or paternity leave through employers. ? Arrange for family and friends to take care of pets while you are in the hospital. ? Purchase a rear-facing car seat and make sure you know how to install it in your car. ? Pack your hospital bag. ? Prepare the baby's nursery. Make sure to remove all pillows and stuffed animals from the baby's crib to prevent suffocation.  Visit your dentist if you have not gone during your pregnancy. Use a soft toothbrush to brush your teeth and be gentle when you floss. Contact a health care provider if:  You are unsure if you are in labor or if your water has broken.  You become dizzy.  You have mild pelvic cramps, pelvic pressure, or nagging pain in your abdominal area.  You have lower back pain.  You have persistent  nausea, vomiting, or diarrhea.  You have an unusual or bad smelling vaginal discharge.  You have pain when you urinate. Get help right away if:  Your water breaks before 37 weeks.  You have regular contractions less than 5 minutes apart before 37 weeks.  You have a fever.  You are leaking fluid from your vagina.  You have spotting or bleeding from your vagina.  You have severe abdominal pain or cramping.  You have rapid weight loss or weight gain.    You have shortness of breath with chest pain.  You notice sudden or extreme swelling of your face, hands, ankles, feet, or legs.  Your baby makes fewer than 10 movements in 2 hours.  You have severe headaches that do not go away when you take medicine.  You have vision changes. Summary  The third trimester is from week 28 through week 40, months 7 through 9. The third trimester is a time when the unborn baby (fetus) is growing rapidly.  During the third trimester, your discomfort may increase as you and your baby continue to gain weight. You may have abdominal, leg, and back pain, sleeping problems, and an increased need to urinate.  During the third trimester your breasts will keep growing and they will continue to become tender. A yellow fluid (colostrum) may leak from your breasts. This is the first milk you are producing for your baby.  False labor is a condition in which you feel small, irregular tightenings of the muscles in the womb (contractions) that eventually go away. These are called Braxton Hicks contractions. Contractions may last for hours, days, or even weeks before true labor sets in.  Signs of labor can include: abdominal cramps; regular contractions that start at 10 minutes apart and become stronger and more frequent with time; watery or bloody mucus discharge that comes from the vagina; increased pelvic pressure and dull back pain; and leaking of amniotic fluid. This information is not intended to replace advice  given to you by your health care provider. Make sure you discuss any questions you have with your health care provider. Document Released: 04/27/2001 Document Revised: 10/09/2015 Document Reviewed: 07/04/2012 Elsevier Interactive Patient Education  2017 [employer]  

## 2017-03-07 NOTE — Progress Notes (Signed)
   PRENATAL VISIT NOTE  Subjective:  Ana Ellis is a 36 y.o. G5P4004 at 6447w3d being seen today for ongoing prenatal care.  She is currently monitored for the following issues for this low-risk pregnancy and has Supervision of other high risk pregnancies, first trimester; Obesity in pregnancy; AMA (advanced maternal age) multigravida 35+; GBS bacteriuria; and GBS (group B streptococcus) UTI complicating pregnancy on her problem list.  Patient reports no complaints.  Contractions: Irritability. Vag. Bleeding: None.  Movement: Present. Denies leaking of fluid.   The following portions of the patient's history were reviewed and updated as appropriate: allergies, current medications, past family history, past medical history, past social history, past surgical history and problem list. Problem list updated.  Objective:   Vitals:   03/07/17 0811  BP: 113/67  Pulse: (!) 103  Weight: 257 lb (116.6 kg)    Fetal Status:     Movement: Present     General:  Alert, oriented and cooperative. Patient is in no acute distress.  Skin: Skin is warm and dry. No rash noted.   Cardiovascular: Normal heart rate noted  Respiratory: Normal respiratory effort, no problems with respiration noted  Abdomen: Soft, gravid, appropriate for gestational age.  Pain/Pressure: Absent     Pelvic: Cervical exam deferred        Extremities: Normal range of motion.  Edema: None  Mental Status:  Normal mood and affect. Normal behavior. Normal judgment and thought content.   Assessment and Plan:  Pregnancy: G5P4004 at 7147w3d   1. Encounter for supervision of normal pregnancy, antepartum, unspecified gravidity  - HIV antibody (with reflex) - CBC - RPR - 2Hr GTT w/ 1 Hr Carpenter 75 g - Tdap vaccine greater than or equal to 7yo IM  Preterm labor symptoms and general obstetric precautions including but not limited to vaginal bleeding, contractions, leaking of fluid and fetal movement were reviewed in detail with the  patient. Please refer to After Visit Summary for other counseling recommendations.   RTO 2 wks   Wynelle BourgeoisMarie Williams, CNM

## 2017-03-08 ENCOUNTER — Encounter: Payer: Self-pay | Admitting: Advanced Practice Midwife

## 2017-03-08 ENCOUNTER — Telehealth: Payer: Self-pay | Admitting: *Deleted

## 2017-03-08 DIAGNOSIS — O24419 Gestational diabetes mellitus in pregnancy, unspecified control: Secondary | ICD-10-CM | POA: Insufficient documentation

## 2017-03-08 DIAGNOSIS — O09891 Supervision of other high risk pregnancies, first trimester: Secondary | ICD-10-CM

## 2017-03-08 LAB — CBC
HCT: 34.5 % — ABNORMAL LOW (ref 35.0–45.0)
HEMOGLOBIN: 11.1 g/dL — AB (ref 11.7–15.5)
MCH: 25.4 pg — AB (ref 27.0–33.0)
MCHC: 32.2 g/dL (ref 32.0–36.0)
MCV: 78.9 fL — AB (ref 80.0–100.0)
MPV: 9.6 fL (ref 7.5–12.5)
Platelets: 227 10*3/uL (ref 140–400)
RBC: 4.37 10*6/uL (ref 3.80–5.10)
RDW: 15.6 % — ABNORMAL HIGH (ref 11.0–15.0)
WBC: 8.1 10*3/uL (ref 3.8–10.8)

## 2017-03-08 LAB — RPR: RPR: NONREACTIVE

## 2017-03-08 LAB — 2HR GTT W 1 HR, CARPENTER, 75 G
GLUCOSE, 1 HR, GEST: 204 mg/dL — AB (ref 65–179)
GLUCOSE, 2 HR, GEST: 58 mg/dL — AB (ref 65–152)
GLUCOSE, FASTING, GEST: 83 mg/dL (ref 65–91)

## 2017-03-08 LAB — HIV ANTIBODY (ROUTINE TESTING W REFLEX): HIV: NONREACTIVE

## 2017-03-09 NOTE — Telephone Encounter (Signed)
Pt notified of abnormal 2 hr GTT and referral placed for N and D education.

## 2017-03-21 ENCOUNTER — Ambulatory Visit (INDEPENDENT_AMBULATORY_CARE_PROVIDER_SITE_OTHER): Payer: Medicare Other | Admitting: Advanced Practice Midwife

## 2017-03-21 VITALS — BP 127/77 | HR 109 | Wt 262.0 lb

## 2017-03-21 DIAGNOSIS — B951 Streptococcus, group B, as the cause of diseases classified elsewhere: Secondary | ICD-10-CM

## 2017-03-21 DIAGNOSIS — R399 Unspecified symptoms and signs involving the genitourinary system: Secondary | ICD-10-CM

## 2017-03-21 DIAGNOSIS — O09523 Supervision of elderly multigravida, third trimester: Secondary | ICD-10-CM

## 2017-03-21 DIAGNOSIS — O09893 Supervision of other high risk pregnancies, third trimester: Secondary | ICD-10-CM

## 2017-03-21 DIAGNOSIS — O2343 Unspecified infection of urinary tract in pregnancy, third trimester: Secondary | ICD-10-CM

## 2017-03-21 DIAGNOSIS — O24419 Gestational diabetes mellitus in pregnancy, unspecified control: Secondary | ICD-10-CM

## 2017-03-21 DIAGNOSIS — O09891 Supervision of other high risk pregnancies, first trimester: Secondary | ICD-10-CM

## 2017-03-21 LAB — POCT URINALYSIS DIPSTICK
Bilirubin, UA: NEGATIVE
GLUCOSE UA: NEGATIVE
Ketones, UA: NEGATIVE
Nitrite, UA: POSITIVE
SPEC GRAV UA: 1.015 (ref 1.010–1.025)
UROBILINOGEN UA: 0.2 U/dL
pH, UA: 6 (ref 5.0–8.0)

## 2017-03-21 NOTE — Progress Notes (Signed)
   PRENATAL VISIT NOTE  Subjective:  Ana Ellis is a 36 y.o. G5P4004 at 1326w3d being seen today for ongoing prenatal care.  She is currently monitored for the following issues for this high-risk pregnancy and has Supervision of other high risk pregnancies, first trimester; Obesity in pregnancy; AMA (advanced maternal age) multigravida 35+; GBS bacteriuria; GBS (group B streptococcus) UTI complicating pregnancy; and Gestational diabetes on their problem list.  Patient reports no complaints and No urinary compliaints. .  Contractions: Irritability. Vag. Bleeding: None.  Movement: Present. Denies leaking of fluid.   The following portions of the patient's history were reviewed and updated as appropriate: allergies, current medications, past family history, past medical history, past social history, past surgical history and problem list. Problem list updated.  Objective:   Vitals:   03/21/17 0856  BP: 127/77  Pulse: (!) 109  Weight: 262 lb (118.8 kg)    Fetal Status: Fetal Heart Rate (bpm): 145 Fundal Height: 34 cm Movement: Present  Presentation: Vertex  General:  Alert, oriented and cooperative. Patient is in no acute distress.  Skin: Skin is warm and dry. No rash noted.   Cardiovascular: Normal heart rate noted  Respiratory: Normal respiratory effort, no problems with respiration noted  Abdomen: Soft, gravid, appropriate for gestational age.  Pain/Pressure: Present     Pelvic: Cervical exam deferred        Extremities: Normal range of motion.  Edema: None  Mental Status:  Normal mood and affect. Normal behavior. Normal judgment and thought content.   Results for orders placed or performed in visit on 03/21/17 (from the past 24 hour(s))  POCT Urinalysis Dipstick     Status: Abnormal   Collection Time: 03/21/17  9:06 AM  Result Value Ref Range   Color, UA Yellow    Clarity, UA Clear    Glucose, UA neg    Bilirubin, UA neg    Ketones, UA neg    Spec Grav, UA 1.015 1.010 - 1.025     Blood, UA trace    pH, UA 6.0 5.0 - 8.0   Protein, UA trace    Urobilinogen, UA 0.2 0.2 or 1.0 E.U./dL   Nitrite, UA positive    Leukocytes, UA Large (3+) (A) Negative     Assessment and Plan:  Pregnancy: G5P4004 at 4626w3d  1. Supervision of other high risk pregnancies, first trimester   2. UTI symptoms  - POCT Urinalysis Dipstick - Culture, OB Urine  3. Elderly multigravida in third trimester   4. Gestational diabetes mellitus (GDM) in third trimester, gestational diabetes method of control unspecified   5. Group B Streptococcus urinary tract infection affecting pregnancy in third trimester   Preterm labor symptoms and general obstetric precautions including but not limited to vaginal bleeding, contractions, leaking of fluid and fetal movement were reviewed in detail with the patient. Please refer to After Visit Summary for other counseling recommendations.  Return in about 9 days (around 03/30/2017).   Dorathy KinsmanVirginia Viona Hosking, CNM

## 2017-03-21 NOTE — Patient Instructions (Signed)

## 2017-03-22 LAB — CULTURE, OB URINE: Culture: >=100000 — AB

## 2017-03-23 ENCOUNTER — Ambulatory Visit: Payer: Medicare Other | Admitting: Registered"

## 2017-03-24 ENCOUNTER — Telehealth: Payer: Self-pay

## 2017-03-24 DIAGNOSIS — A491 Streptococcal infection, unspecified site: Secondary | ICD-10-CM

## 2017-03-24 MED ORDER — AZITHROMYCIN 250 MG PO TABS: 250.0000 mg | ORAL_TABLET | Freq: Every day | ORAL | 0 refills | Status: DC

## 2017-03-24 NOTE — Telephone Encounter (Signed)
Spoke with [name], MD about the group b strep found in the pt's urine. Considering the pt is allergic to PCN, Dr.Dove recommended treating the pt with Azithromycin. I spoke with pt and she is aware of this and will go pick up the medication.

## 2017-03-30 ENCOUNTER — Encounter: Payer: Medicare Other | Admitting: Obstetrics & Gynecology

## 2017-04-06 ENCOUNTER — Ambulatory Visit (INDEPENDENT_AMBULATORY_CARE_PROVIDER_SITE_OTHER): Payer: Medicare Other | Admitting: Obstetrics & Gynecology

## 2017-04-06 VITALS — BP 129/74 | HR 110 | Wt 262.0 lb

## 2017-04-06 DIAGNOSIS — O09891 Supervision of other high risk pregnancies, first trimester: Secondary | ICD-10-CM

## 2017-04-06 DIAGNOSIS — R8271 Bacteriuria: Secondary | ICD-10-CM

## 2017-04-06 DIAGNOSIS — O09523 Supervision of elderly multigravida, third trimester: Secondary | ICD-10-CM

## 2017-04-06 DIAGNOSIS — O24419 Gestational diabetes mellitus in pregnancy, unspecified control: Secondary | ICD-10-CM | POA: Diagnosis not present

## 2017-04-06 LAB — GLUCOSE, POCT (MANUAL RESULT ENTRY): POC Glucose: 102 mg/dl — AB (ref 70–99)

## 2017-04-06 MED ORDER — ACCU-CHEK AVIVA DEVI: 0 refills | Status: DC

## 2017-04-06 MED ORDER — GLUCOSE BLOOD VI STRP: ORAL_STRIP | 12 refills | Status: DC

## 2017-04-06 MED ORDER — ACCU-CHEK FASTCLIX LANCETS MISC: 1.0000 | Freq: Four times a day (QID) | 12 refills | Status: DC

## 2017-04-06 NOTE — Addendum Note (Signed)
Addended by: Granville LewisLARK, Herberta Pickron L on: 04/06/2017 11:36 AM   Modules accepted: Orders

## 2017-04-11 ENCOUNTER — Other Ambulatory Visit: Payer: Self-pay

## 2017-04-11 ENCOUNTER — Other Ambulatory Visit: Payer: Self-pay | Admitting: *Deleted

## 2017-04-11 DIAGNOSIS — O24419 Gestational diabetes mellitus in pregnancy, unspecified control: Secondary | ICD-10-CM

## 2017-04-11 MED ORDER — GLUCOSE BLOOD VI STRP: ORAL_STRIP | 12 refills | Status: DC

## 2017-04-11 MED ORDER — ACCU-CHEK AVIVA DEVI: 0 refills | Status: DC

## 2017-04-11 MED ORDER — ACCU-CHEK FASTCLIX LANCETS MISC: 1.0000 | Freq: Four times a day (QID) | 12 refills | Status: DC

## 2017-04-11 MED ORDER — ACCU-CHEK FASTCLIX LANCETS MISC
1.0000 | Freq: Four times a day (QID) | 12 refills | Status: DC
Start: 2017-04-11 — End: 2017-04-11

## 2017-04-13 ENCOUNTER — Ambulatory Visit (INDEPENDENT_AMBULATORY_CARE_PROVIDER_SITE_OTHER): Payer: Medicare Other | Admitting: Obstetrics & Gynecology

## 2017-04-13 ENCOUNTER — Other Ambulatory Visit (HOSPITAL_COMMUNITY)
Admission: RE | Admit: 2017-04-13 | Discharge: 2017-04-13 | Disposition: A | Discharge status: Home / Self Care | Payer: Medicare Other | Source: Ambulatory Visit | Attending: Obstetrics & Gynecology | Admitting: Obstetrics & Gynecology

## 2017-04-13 VITALS — BP 124/81 | HR 100 | Wt 266.0 lb

## 2017-04-13 DIAGNOSIS — E669 Obesity, unspecified: Secondary | ICD-10-CM

## 2017-04-13 DIAGNOSIS — O24419 Gestational diabetes mellitus in pregnancy, unspecified control: Secondary | ICD-10-CM | POA: Diagnosis not present

## 2017-04-13 DIAGNOSIS — Z3483 Encounter for supervision of other normal pregnancy, third trimester: Secondary | ICD-10-CM | POA: Diagnosis not present

## 2017-04-13 DIAGNOSIS — O09523 Supervision of elderly multigravida, third trimester: Secondary | ICD-10-CM

## 2017-04-13 DIAGNOSIS — O09893 Supervision of other high risk pregnancies, third trimester: Secondary | ICD-10-CM

## 2017-04-13 DIAGNOSIS — Z3480 Encounter for supervision of other normal pregnancy, unspecified trimester: Secondary | ICD-10-CM

## 2017-04-13 DIAGNOSIS — O99213 Obesity complicating pregnancy, third trimester: Secondary | ICD-10-CM

## 2017-04-13 DIAGNOSIS — O09891 Supervision of other high risk pregnancies, first trimester: Secondary | ICD-10-CM

## 2017-04-13 DIAGNOSIS — R8271 Bacteriuria: Secondary | ICD-10-CM

## 2017-04-13 DIAGNOSIS — O99210 Obesity complicating pregnancy, unspecified trimester: Secondary | ICD-10-CM

## 2017-04-13 LAB — GLUCOSE, POCT (MANUAL RESULT ENTRY): POC Glucose: 107 mg/dl — AB (ref 70–99)

## 2017-04-13 MED ORDER — SULFAMETHOXAZOLE-TRIMETHOPRIM 800-160 MG PO TABS: 1.0000 | ORAL_TABLET | Freq: Two times a day (BID) | ORAL | 0 refills | Status: DC

## 2017-04-13 NOTE — Progress Notes (Signed)
   PRENATAL VISIT NOTE  Subjective:  Ana Ellis is a 36 y.o. G5P4004 at 349w5d being seen today for ongoing prenatal care.  She is currently monitored for the following issues for this high-risk pregnancy and has Supervision of other high risk pregnancies, first trimester; Obesity in pregnancy; AMA (advanced maternal age) multigravida 35+; GBS bacteriuria; GBS (group B streptococcus) UTI complicating pregnancy; and Gestational diabetes on their problem list.  Patient reports no complaints.  Contractions: Irritability. Vag. Bleeding: None.  Movement: Present. Denies leaking of fluid.   The following portions of the patient's history were reviewed and updated as appropriate: allergies, current medications, past family history, past medical history, past social history, past surgical history and problem list. Problem list updated.  Objective:   Vitals:   04/13/17 1029  Weight: 266 lb (120.7 kg)    Fetal Status:     Movement: Present     General:  Alert, oriented and cooperative. Patient is in no acute distress.  Skin: Skin is warm and dry. No rash noted.   Cardiovascular: Normal heart rate noted  Respiratory: Normal respiratory effort, no problems with respiration noted  Abdomen: Soft, gravid, appropriate for gestational age.  Pain/Pressure: Absent     Pelvic: Cervical exam deferred        Extremities: Normal range of motion.  Edema: Trace  Mental Status:  Normal mood and affect. Normal behavior. Normal judgment and thought content.  She has still been unable to test her sugars at home. Her insurance company has not approved the supplies. She has diabetic teaching scheduled for next week. Happily, her postprandial sugar today here at the office is 107.  Assessment and Plan:  Pregnancy: G5P4004 at 2249w5d  1. Supervision of other normal pregnancy, antepartum  - Urine cytology ancillary only  2. Gestational diabetes mellitus (GDM) in third trimester, gestational diabetes method of  control unspecified  - POCT Glucose (CBG)  3. Supervision of other high risk pregnancies, first trimester   4. Obesity in pregnancy   5. GBS bacteriuria - treat in labor  6. Elderly multigravida in third trimester   Preterm labor symptoms and general obstetric precautions including but not limited to vaginal bleeding, contractions, leaking of fluid and fetal movement were reviewed in detail with the patient. Please refer to After Visit Summary for other counseling recommendations.  Return in about 1 week (around 04/20/2017).   Allie BossierMyra C Salma Walrond, MD

## 2017-04-13 NOTE — Progress Notes (Signed)
CBG 2.5 hours after Bkfst 107

## 2017-04-14 LAB — CULTURE, URINE COMPREHENSIVE
MICRO NUMBER:: 81336253
SPECIMEN QUALITY:: ADEQUATE

## 2017-04-14 LAB — URINE CYTOLOGY ANCILLARY ONLY
CHLAMYDIA, DNA PROBE: NEGATIVE
NEISSERIA GONORRHEA: NEGATIVE

## 2017-04-20 ENCOUNTER — Encounter: Payer: Medicare Other | Admitting: Obstetrics & Gynecology

## 2017-04-22 ENCOUNTER — Ambulatory Visit (INDEPENDENT_AMBULATORY_CARE_PROVIDER_SITE_OTHER): Payer: Medicare Other | Admitting: Certified Nurse Midwife

## 2017-04-22 ENCOUNTER — Other Ambulatory Visit (HOSPITAL_COMMUNITY)
Admission: RE | Admit: 2017-04-22 | Discharge: 2017-04-22 | Disposition: A | Discharge status: Home / Self Care | Payer: Medicare Other | Source: Ambulatory Visit | Attending: Obstetrics & Gynecology | Admitting: Obstetrics & Gynecology

## 2017-04-22 VITALS — BP 137/77 | HR 112 | Wt 266.0 lb

## 2017-04-22 DIAGNOSIS — O0993 Supervision of high risk pregnancy, unspecified, third trimester: Secondary | ICD-10-CM

## 2017-04-22 DIAGNOSIS — O09891 Supervision of other high risk pregnancies, first trimester: Secondary | ICD-10-CM

## 2017-04-22 DIAGNOSIS — O26849 Uterine size-date discrepancy, unspecified trimester: Secondary | ICD-10-CM | POA: Insufficient documentation

## 2017-04-22 DIAGNOSIS — O26843 Uterine size-date discrepancy, third trimester: Secondary | ICD-10-CM

## 2017-04-22 DIAGNOSIS — O0990 Supervision of high risk pregnancy, unspecified, unspecified trimester: Secondary | ICD-10-CM | POA: Diagnosis not present

## 2017-04-22 DIAGNOSIS — R8271 Bacteriuria: Secondary | ICD-10-CM

## 2017-04-22 DIAGNOSIS — O24410 Gestational diabetes mellitus in pregnancy, diet controlled: Secondary | ICD-10-CM

## 2017-04-22 LAB — GLUCOSE, POCT (MANUAL RESULT ENTRY): POC Glucose: 132 mg/dl — AB (ref 70–99)

## 2017-04-22 LAB — OB RESULTS CONSOLE GC/CHLAMYDIA: GC PROBE AMP, GENITAL: NEGATIVE

## 2017-04-22 LAB — OB RESULTS CONSOLE GBS: GBS: NEGATIVE

## 2017-04-22 NOTE — Progress Notes (Signed)
Subjective:  Ana Ellis is a 36 y.o. G5P4004 at 5870w0d being seen today for ongoing prenatal care.  She is currently monitored for the following issues for this high-risk pregnancy and has Supervision of other high risk pregnancies, first trimester; Obesity in pregnancy; AMA (advanced maternal age) multigravida 35+; GBS bacteriuria; GBS (group B streptococcus) UTI complicating pregnancy; and Gestational diabetes on their problem list.  Patient reports pelvic pain and pressure x3 days.  Contractions: Irritability. Vag. Bleeding: None.  Movement: Present. Denies leaking of fluid.   The following portions of the patient's history were reviewed and updated as appropriate: allergies, current medications, past family history, past medical history, past social history, past surgical history and problem list. Problem list updated.  Objective:   Vitals:   04/22/17 1101  BP: 137/77  Pulse: (!) 112  Weight: 266 lb (120.7 kg)    Fetal Status: Fetal Heart Rate (bpm): 152 Fundal Height: 40 cm Movement: Present  Presentation: Vertex  General:  Alert, oriented and cooperative. Patient is in no acute distress.  Skin: Skin is warm and dry. No rash noted.   Cardiovascular: Normal heart rate noted  Respiratory: Normal respiratory effort, no problems with respiration noted  Abdomen: Soft, gravid, appropriate for gestational age. Pain/Pressure: Present     Pelvic: Vag. Bleeding: None Vag D/C Character: Thin   Cervical exam performed Dilation: Fingertip      Extremities: Normal range of motion.  Edema: Trace  Mental Status: Normal mood and affect. Normal behavior. Normal judgment and thought content.   Urinalysis: Urine Protein: Trace Urine Glucose: Negative  Assessment and Plan:  Pregnancy: G5P4004 at 6670w0d  1. Supervision of high risk pregnancy, antepartum - Urine cytology ancillary only - POCT Glucose (CBG) - Culture, Grp B Strep w/Rflx Suscept  2. Diet controlled gestational diabetes mellitus  (GDM) in third trimester - CBG 132 today (3 hrs pp) - pt states unable to obtain [employer]eter-awaiting insurance approval - nutrition counseling complete (per pt), following diet - advised pt to buy generic glucometer this week, check FBS and 2 hrs pp >bring log to nv - US MFM OB FOLLOW UP; Future >1 week  3. GBS bacteriuria - PCN allergy- anaphylaxis - culture for sensitivities today  4. Size dates discrepancy - US next week  Term labor symptoms and general obstetric precautions including but not limited to vaginal bleeding, contractions, leaking of fluid and fetal movement were reviewed in detail with the patient. Please refer to After Visit Summary for other counseling recommendations.  Return in about 1 week (around 04/29/2017).   Donette LarryBhambri, Jermal Dismuke, CNM

## 2017-04-22 NOTE — Progress Notes (Signed)
CBG today 132

## 2017-04-23 LAB — URINE CYTOLOGY ANCILLARY ONLY
CHLAMYDIA, DNA PROBE: NEGATIVE
Neisseria Gonorrhea: NEGATIVE

## 2017-04-25 LAB — CULTURE, STREPTOCOCCUS GRP B W/SUSCEPT
MICRO NUMBER: 81379464
SPECIMEN QUALITY:: ADEQUATE

## 2017-04-29 ENCOUNTER — Other Ambulatory Visit: Payer: Self-pay | Admitting: Certified Nurse Midwife

## 2017-04-29 ENCOUNTER — Ambulatory Visit (HOSPITAL_COMMUNITY)
Admission: RE | Admit: 2017-04-29 | Discharge: 2017-04-29 | Disposition: A | Discharge status: Home / Self Care | Payer: Medicare Other | Source: Ambulatory Visit | Attending: Certified Nurse Midwife | Admitting: Certified Nurse Midwife

## 2017-04-29 DIAGNOSIS — O26843 Uterine size-date discrepancy, third trimester: Secondary | ICD-10-CM

## 2017-04-29 DIAGNOSIS — Z3A38 38 weeks gestation of pregnancy: Secondary | ICD-10-CM

## 2017-04-29 DIAGNOSIS — O24410 Gestational diabetes mellitus in pregnancy, diet controlled: Secondary | ICD-10-CM

## 2017-04-29 DIAGNOSIS — O09523 Supervision of elderly multigravida, third trimester: Secondary | ICD-10-CM | POA: Insufficient documentation

## 2017-04-29 DIAGNOSIS — O26849 Uterine size-date discrepancy, unspecified trimester: Secondary | ICD-10-CM

## 2017-04-29 DIAGNOSIS — O3663X0 Maternal care for excessive fetal growth, third trimester, not applicable or unspecified: Secondary | ICD-10-CM | POA: Diagnosis not present

## 2017-04-30 NOTE — Addendum Note (Signed)
Encounter addended by: Donette LarryBhambri, Melburn Treiber, CNM on: 04/30/2017 8:14 PM  Actions taken: Visit diagnoses modified, Problem List modified

## 2017-05-02 ENCOUNTER — Inpatient Hospital Stay (HOSPITAL_COMMUNITY)
Admission: AD | Admit: 2017-05-02 | Discharge: 2017-05-05 | DRG: 807 | Disposition: A | Discharge status: Home / Self Care | Payer: Medicare Other | Source: Ambulatory Visit | Attending: Family Medicine | Admitting: Family Medicine

## 2017-05-02 ENCOUNTER — Encounter (HOSPITAL_COMMUNITY): Payer: Self-pay

## 2017-05-02 ENCOUNTER — Ambulatory Visit (INDEPENDENT_AMBULATORY_CARE_PROVIDER_SITE_OTHER): Payer: Medicare Other | Admitting: Obstetrics & Gynecology

## 2017-05-02 ENCOUNTER — Encounter (HOSPITAL_COMMUNITY): Payer: Self-pay | Admitting: Certified Registered Nurse Anesthetist

## 2017-05-02 ENCOUNTER — Encounter: Payer: Self-pay | Admitting: Obstetrics & Gynecology

## 2017-05-02 VITALS — BP 146/81 | HR 97 | Wt 267.0 lb

## 2017-05-02 DIAGNOSIS — O24420 Gestational diabetes mellitus in childbirth, diet controlled: Secondary | ICD-10-CM | POA: Diagnosis present

## 2017-05-02 DIAGNOSIS — E669 Obesity, unspecified: Secondary | ICD-10-CM | POA: Diagnosis present

## 2017-05-02 DIAGNOSIS — Z3A38 38 weeks gestation of pregnancy: Secondary | ICD-10-CM | POA: Diagnosis not present

## 2017-05-02 DIAGNOSIS — O2340 Unspecified infection of urinary tract in pregnancy, unspecified trimester: Secondary | ICD-10-CM | POA: Diagnosis present

## 2017-05-02 DIAGNOSIS — O1494 Unspecified pre-eclampsia, complicating childbirth: Secondary | ICD-10-CM | POA: Diagnosis not present

## 2017-05-02 DIAGNOSIS — O1410 Severe pre-eclampsia, unspecified trimester: Secondary | ICD-10-CM | POA: Diagnosis present

## 2017-05-02 DIAGNOSIS — O164 Unspecified maternal hypertension, complicating childbirth: Secondary | ICD-10-CM | POA: Diagnosis not present

## 2017-05-02 DIAGNOSIS — O134 Gestational [pregnancy-induced] hypertension without significant proteinuria, complicating childbirth: Secondary | ICD-10-CM | POA: Diagnosis not present

## 2017-05-02 DIAGNOSIS — O99824 Streptococcus B carrier state complicating childbirth: Secondary | ICD-10-CM | POA: Diagnosis present

## 2017-05-02 DIAGNOSIS — O24419 Gestational diabetes mellitus in pregnancy, unspecified control: Secondary | ICD-10-CM | POA: Diagnosis not present

## 2017-05-02 DIAGNOSIS — O09529 Supervision of elderly multigravida, unspecified trimester: Secondary | ICD-10-CM

## 2017-05-02 DIAGNOSIS — O3663X0 Maternal care for excessive fetal growth, third trimester, not applicable or unspecified: Secondary | ICD-10-CM | POA: Diagnosis present

## 2017-05-02 DIAGNOSIS — O99210 Obesity complicating pregnancy, unspecified trimester: Secondary | ICD-10-CM | POA: Diagnosis present

## 2017-05-02 DIAGNOSIS — O99214 Obesity complicating childbirth: Secondary | ICD-10-CM | POA: Diagnosis present

## 2017-05-02 DIAGNOSIS — O1414 Severe pre-eclampsia complicating childbirth: Principal | ICD-10-CM | POA: Diagnosis present

## 2017-05-02 DIAGNOSIS — B951 Streptococcus, group B, as the cause of diseases classified elsewhere: Secondary | ICD-10-CM | POA: Diagnosis present

## 2017-05-02 DIAGNOSIS — Z880 Allergy status to penicillin: Secondary | ICD-10-CM | POA: Diagnosis not present

## 2017-05-02 HISTORY — DX: Gestational diabetes mellitus in pregnancy, unspecified control: O24.419

## 2017-05-02 LAB — COMPREHENSIVE METABOLIC PANEL
ALBUMIN: 2.7 g/dL — AB (ref 3.5–5.0)
ALT: 10 U/L — AB (ref 14–54)
AST: 18 U/L (ref 15–41)
Alkaline Phosphatase: 255 U/L — ABNORMAL HIGH (ref 38–126)
Anion gap: 11 (ref 5–15)
BUN: 10 mg/dL (ref 6–20)
CHLORIDE: 102 mmol/L (ref 101–111)
CO2: 21 mmol/L — AB (ref 22–32)
CREATININE: 0.46 mg/dL (ref 0.44–1.00)
Calcium: 8.8 mg/dL — ABNORMAL LOW (ref 8.9–10.3)
GFR calc Af Amer: >60 mL/min (ref >60)
GFR calc non Af Amer: >60 mL/min (ref >60)
GLUCOSE: 101 mg/dL — AB (ref 65–99)
Potassium: 4 mmol/L (ref 3.5–5.1)
SODIUM: 134 mmol/L — AB (ref 135–145)
Total Bilirubin: 0.3 mg/dL (ref 0.3–1.2)
Total Protein: 5.8 g/dL — ABNORMAL LOW (ref 6.5–8.1)

## 2017-05-02 LAB — PROTEIN / CREATININE RATIO, URINE
Creatinine, Urine: 70 mg/dL
Protein Creatinine Ratio: 0.19 mg/mg{Cre} — ABNORMAL HIGH (ref 0.00–0.15)
Total Protein, Urine: 13 mg/dL

## 2017-05-02 LAB — GLUCOSE, CAPILLARY: GLUCOSE-CAPILLARY: 83 mg/dL (ref 65–99)

## 2017-05-02 LAB — CBC
HEMATOCRIT: 36.6 % (ref 36.0–46.0)
HEMOGLOBIN: 11.4 g/dL — AB (ref 12.0–15.0)
MCH: 25.2 pg — ABNORMAL LOW (ref 26.0–34.0)
MCHC: 31.1 g/dL (ref 30.0–36.0)
MCV: 81 fL (ref 78.0–100.0)
Platelets: 238 10*3/uL (ref 150–400)
RBC: 4.52 MIL/uL (ref 3.87–5.11)
RDW: 16.8 % — AB (ref 11.5–15.5)
WBC: 9.9 10*3/uL (ref 4.0–10.5)

## 2017-05-02 LAB — POCT CBG (FASTING - GLUCOSE)-MANUAL ENTRY: GLUCOSE FASTING, POC: 94 mg/dL (ref 70–99)

## 2017-05-02 LAB — TYPE AND SCREEN
ABO/RH(D): O POS
Antibody Screen: NEGATIVE

## 2017-05-02 MED ORDER — LACTATED RINGERS IV SOLN
INTRAVENOUS | Status: DC
  Administered 2017-05-02 – 2017-05-04 (×4): via INTRAVENOUS

## 2017-05-02 MED ORDER — ONDANSETRON HCL 4 MG/2ML IJ SOLN: 4.0000 mg | Freq: Four times a day (QID) | INTRAMUSCULAR | Status: DC | PRN

## 2017-05-02 MED ORDER — HYDRALAZINE HCL 20 MG/ML IJ SOLN: 10.0000 mg | Freq: Once | INTRAMUSCULAR | Status: DC | PRN

## 2017-05-02 MED ORDER — MAGNESIUM SULFATE 40 G IN LACTATED RINGERS - SIMPLE
2.0000 g/h | INTRAVENOUS | Status: AC
  Administered 2017-05-03: 2 g/h via INTRAVENOUS
  Filled 2017-05-02: qty 500
  Filled 2017-05-02: qty 40

## 2017-05-02 MED ORDER — SOD CITRATE-CITRIC ACID 500-334 MG/5ML PO SOLN: 30.0000 mL | ORAL | Status: DC | PRN

## 2017-05-02 MED ORDER — TERBUTALINE SULFATE 1 MG/ML IJ SOLN: 0.2500 mg | Freq: Once | INTRAMUSCULAR | Status: DC | PRN

## 2017-05-02 MED ORDER — OXYTOCIN 40 UNITS IN LACTATED RINGERS INFUSION - SIMPLE MED: 2.5000 [IU]/h | INTRAVENOUS | Status: DC

## 2017-05-02 MED ORDER — ACETAMINOPHEN 500 MG PO TABS
1000.0000 mg | ORAL_TABLET | Freq: Once | ORAL | Status: AC
  Administered 2017-05-03: 1000 mg via ORAL
  Filled 2017-05-02: qty 2

## 2017-05-02 MED ORDER — LACTATED RINGERS IV SOLN: 500.0000 mL | INTRAVENOUS | Status: DC | PRN

## 2017-05-02 MED ORDER — LABETALOL HCL 5 MG/ML IV SOLN
20.0000 mg | INTRAVENOUS | Status: DC | PRN
  Administered 2017-05-02: 20 mg via INTRAVENOUS
  Administered 2017-05-03: 40 mg via INTRAVENOUS
  Filled 2017-05-02: qty 8
  Filled 2017-05-02: qty 4
  Filled 2017-05-02 (×2): qty 8

## 2017-05-02 MED ORDER — OXYCODONE-ACETAMINOPHEN 5-325 MG PO TABS: 2.0000 | ORAL_TABLET | ORAL | Status: DC | PRN

## 2017-05-02 MED ORDER — VANCOMYCIN HCL IN DEXTROSE 1-5 GM/200ML-% IV SOLN
1000.0000 mg | Freq: Two times a day (BID) | INTRAVENOUS | Status: DC
  Administered 2017-05-02 – 2017-05-03 (×2): 1000 mg via INTRAVENOUS
  Filled 2017-05-02 (×3): qty 200

## 2017-05-02 MED ORDER — FLEET ENEMA 7-19 GM/118ML RE ENEM: 1.0000 | ENEMA | RECTAL | Status: DC | PRN

## 2017-05-02 MED ORDER — ACETAMINOPHEN 325 MG PO TABS
650.0000 mg | ORAL_TABLET | ORAL | Status: DC | PRN
Start: 2017-05-02 — End: 2017-05-05
  Administered 2017-05-02: 650 mg via ORAL
  Filled 2017-05-02: qty 2

## 2017-05-02 MED ORDER — LIDOCAINE HCL (PF) 1 % IJ SOLN
30.0000 mL | INTRAMUSCULAR | Status: AC | PRN
  Administered 2017-05-02: 30 mL via SUBCUTANEOUS
  Filled 2017-05-02: qty 30

## 2017-05-02 MED ORDER — MISOPROSTOL 50MCG HALF TABLET
50.0000 ug | ORAL_TABLET | ORAL | Status: DC
  Administered 2017-05-02 – 2017-05-03 (×2): 50 ug via ORAL
  Filled 2017-05-02 (×7): qty 1

## 2017-05-02 MED ORDER — FENTANYL CITRATE (PF) 100 MCG/2ML IJ SOLN
100.0000 ug | INTRAMUSCULAR | Status: DC | PRN
  Administered 2017-05-03 (×2): 100 ug via INTRAVENOUS
  Filled 2017-05-02 (×2): qty 2

## 2017-05-02 MED ORDER — MAGNESIUM SULFATE BOLUS VIA INFUSION
4.0000 g | Freq: Once | INTRAVENOUS | Status: AC
  Administered 2017-05-02: 4 g via INTRAVENOUS
  Filled 2017-05-02: qty 500

## 2017-05-02 MED ORDER — OXYCODONE-ACETAMINOPHEN 5-325 MG PO TABS: 1.0000 | ORAL_TABLET | ORAL | Status: DC | PRN

## 2017-05-02 MED ORDER — OXYTOCIN BOLUS FROM INFUSION
500.0000 mL | Freq: Once | INTRAVENOUS | Status: AC
  Administered 2017-05-03: 500 mL via INTRAVENOUS

## 2017-05-02 NOTE — Progress Notes (Signed)
HA's and pt feels she is getting another UTI    PRENATAL VISIT NOTE  Subjective:  Ana Ellis is a 36 y.o. G5P4004 at 4170w3d being seen today for ongoing prenatal care.  She is currently monitored for the following issues for this high-risk pregnancy and has Supervision of high risk pregnancy, antepartum; Obesity in pregnancy; AMA (advanced maternal age) multigravida 35+; GBS (group B streptococcus) UTI complicating pregnancy; Gestational diabetes; and Significant discrepancy between uterine size and clinical dates, antepartum on their problem list.  Patient reports headache and visual distrubances.  Contractions: Irritability. Vag. Bleeding: None.  Movement: Present. Denies leaking of fluid.   The following portions of the patient's history were reviewed and updated as appropriate: allergies, current medications, past family history, past medical history, past social history, past surgical history and problem list. Problem list updated.  Objective:   Vitals:   05/02/17 1508  BP: (!) 146/81  Pulse: 97  Weight: 267 lb (121.1 kg)    Fetal Status: Fetal Heart Rate (bpm): 150   Movement: Present     General:  Alert, oriented and cooperative. Patient is in no acute distress.  Skin: Skin is warm and dry. No rash noted.   Cardiovascular: Normal heart rate noted  Respiratory: Normal respiratory effort, no problems with respiration noted  Abdomen: Soft, gravid, appropriate for gestational age.  Pain/Pressure: Present     Pelvic: Cervical exam deferred        Extremities: Normal range of motion.  Edema: Trace  Mental Status:  Normal mood and affect. Normal behavior. Normal judgment and thought content.   Assessment and Plan:  Pregnancy: G5P4004 at 1870w3d  1. Gestational diabetes mellitus (GDM), antepartum, gestational diabetes method of control unspecified - POCT CBG (Fasting - Glucose)=94 2.5 hours pp.  2. Preeclampsia--Headache with HTN.  Will admit to L & D for induction.  Dr. Macon LargeAnyanwu  aware and accepts patient.  NST reactive.   Term labor symptoms and general obstetric precautions including but not limited to vaginal bleeding, contractions, leaking of fluid and fetal movement were reviewed in detail with the patient. Please refer to After Visit Summary for other counseling recommendations.  Return in about 6 weeks (around 06/13/2017).   Elsie LincolnKelly Jeanice Dempsey, MD

## 2017-05-02 NOTE — Anesthesia Pain Management Evaluation Note (Signed)
  CRNA Pain Management Visit Note  Patient: Ana Ellis, 36 y.o., female  "Hello I am a member of the anesthesia team at [facility]. We have an anesthesia team available at all times to provide care throughout the hospital, including epidural management and anesthesia for C-section. I don't know your plan for the delivery whether it a natural birth, water birth, IV sedation, nitrous supplementation, doula or epidural, but we want to meet your pain goals."   1.Was your pain managed to your expectations on prior hospitalizations?   Yes   2.What is your expectation for pain management during this hospitalization?     Epidural  3.How can we help you reach that goal? Epidural when ready.  Record the patient's initial score and the patient's pain goal.   Pain: 0  Pain Goal: 7 [facility] wants you to be able to say your pain was always managed very well.  Even Budlong L 05/02/2017

## 2017-05-02 NOTE — H&P (Signed)
LABOR AND DELIVERY ADMISSION HISTORY AND PHYSICAL NOTE  Ana Ellis is a 36 y.o. female 585P4004 with IUP at 1382w3d by US presenting for IOL 2/2 gestational HTN. She has had a headache all day, but has not taken anything for this. Denies RUQ/epigastric pain, visual disturbances or SOB.   She reports positive fetal movement. She denies leakage of fluid or vaginal bleeding.  [facility] History/Complications[facility] Pregnancy complications:  - Gestational HTN, GDM, GBS pos  Clinic  [place] Prenatal Labs  Dating  early bedside u/s Blood type:   O+  Genetic Screen  Declines Antibody: Neg  Anatomic US  nml anatomy Rubella:  Immune  GTT  Early:  130             Third trimester: 204  RPR:   NR  Flu vaccine  Declined HBsAg:   Neg  TDaP vaccine Given 03/07/17                           HIV:   NR  Baby Food  Breast                                            GBS: GBS Bacteruria  Contraception  Vasectomy planned Pap: normal 2017  Circumcision N/A   Pediatrician [facility]   Support Person Tasia CatchingsCraig     Past Medical History: Past Medical History:  Diagnosis Date  . Gestational diabetes     Past Surgical History: Past Surgical History:  Procedure Laterality Date  . BREAST REDUCTION SURGERY    . HAND SURGERY    . KNEE SURGERY      Obstetrical History: OB History    Gravida Para Term Preterm AB Living   5 4 4     4    SAB TAB Ectopic Multiple Live Births                  Social History: Social History   Socioeconomic History  . Marital status: Single    Spouse name: None  . Number of children: None  . Years of education: None  . Highest education level: None  Social Needs  . Financial resource strain: None  . Food insecurity - worry: None  . Food insecurity - inability: None  . Transportation needs - medical: None  . Transportation needs - non-medical: None  Occupational History  . None  Tobacco Use  . Smoking status: Never Smoker  . Smokeless tobacco: Never  Used  Substance and Sexual Activity  . Alcohol use: No  . Drug use: No  . Sexual activity: Yes    Birth control/protection: None  Other Topics Concern  . None  Social History Narrative  . None    Family History: History reviewed. No pertinent family history.  Allergies: Allergies  Allergen Reactions  . Penicillins Hives, Shortness Of Breath and Nausea Only    Medications Prior to Admission  Medication Sig Dispense Refill Last Dose  . Prenatal Vit-Fe Fumarate-FA (MULTIVITAMIN-PRENATAL) 27-0.8 MG TABS tablet Take 1 tablet by mouth daily at 12 noon.   05/02/2017 at 0815  . ACCU-CHEK FASTCLIX LANCETS MISC 1 Device by Percutaneous route 4 (four) times daily. 024.419 (Patient not taking: Reported on 05/02/2017) 100 each 12 Not Taking  . Blood Glucose Monitoring Suppl (ACCU-CHEK AVIVA) device Test blood sugars at least 4 times  a day 024.419 (Patient not taking: Reported on 05/02/2017) 1 each 0 Not Taking  . glucose blood test strip Test blood sugar every 4 hours (Patient not taking: Reported on 05/02/2017) 100 each 12 Not Taking     Review of Systems  All systems reviewed and negative except as stated in HPI  Physical Exam Blood pressure (!) 158/93, pulse (!) 129, temperature 97.9 F (36.6 C), temperature source Oral, resp. rate 16, height 5\' 1"  (1.549 m), weight 121.1 kg (267 lb), last menstrual period 07/27/2016. General appearance: alert, cooperative, appears stated age, no distress and morbidly obese Lungs: Normal respiratory effort,  Heart: regular rate, distal pusles intact Abdomen: soft, non-tender; bowel sounds normal Extremities: No calf swelling or tenderness Presentation: vertex by bedside ultrasound  Fetal monitoring: baseline rate 150, moderate variability, +acel, no decel Uterine activity: mild UI Dilation: Closed Effacement (%): Thick Station: Ballotable Exam by:: Dr.Bland  Prenatal labs: ABO, Rh: O/POS/-- (05/17 1455) Antibody: NEG (05/17 1455) Rubella:  2.48 (05/17 1455) RPR: NON-REACTIVE (10/22 0841)  HBsAg: NEGATIVE (05/17 1455)  HIV: NON-REACTIVE (10/22 0841)  GC/Chlamydia: neg GBS: Negative (12/07 0000)  1 hr Glucola: 83/204/58, GDM Genetic screening:  declined Anatomy US: normal female  Prenatal Transfer Tool  Maternal Diabetes: Yes:  Diabetes Type:  Diet controlled Genetic Screening: Declined Maternal Ultrasounds/Referrals: Normal Fetal Ultrasounds or other Referrals:  None Maternal Substance Abuse:  No Significant Maternal Medications:  None Significant Maternal Lab Results: Lab values include: Group B Strep positive  Results for orders placed or performed during the hospital encounter of 05/02/17 (from the past 24 hour(s))  Glucose, capillary   Collection Time: 05/02/17  8:44 PM  Result Value Ref Range   Glucose-Capillary 83 65 - 99 mg/dL  Results for orders placed or performed in visit on 05/02/17 (from the past 24 hour(s))  POCT CBG (Fasting - Glucose)   Collection Time: 05/02/17  3:50 PM  Result Value Ref Range   Glucose Fasting, POC 94 70 - 99 mg/dL    Patient Active Problem List   Diagnosis Date Noted  . PIH (pregnancy induced hypertension) 05/02/2017  . Significant discrepancy between uterine size and clinical dates, antepartum 04/22/2017  . Gestational diabetes 03/08/2017  . GBS (group B streptococcus) UTI complicating pregnancy 01/13/2017  . Supervision of high risk pregnancy, antepartum 09/30/2016  . Obesity in pregnancy 09/30/2016  . AMA (advanced maternal age) multigravida 35+ 09/30/2016    Assessment: Ana Ellis is a 36 y.o. G5P4004 at 6554w3d here for IOL 2/2 gestational HTN.   Also GDM, GBS pos  #Labor: starting cytotec #Pain: Per maternal request, planning epidural #FWB: Cat 1  #ID:  gbs pos #MOF: both #MOC:IUD #Circ:  Female baby  Marthenia RollingScott Bland 05/02/2017, 8:57 PM  OB FELLOW HISTORY AND PHYSICAL ATTESTATION  I have seen and examined this patient; I agree with above documentation in the  resident's note. Modifications made as appropriate  GHTN--> Severe Pre-E: Pt has headache, and now BP are in severe range. Start IV Mag. Labetalol protocol to treat Bps IOL: Cytotec vaginally now to start cervical ripening. Will place FB when able FWB: Cat I LGA: U/S on 12/14, EFW 8lb12oz/>90%, AC>97%. Shoulder dystocia precautions   Frederik PearJulie P Blakeleigh Domek, MD OB Fellow 05/02/2017, 9:43 PM

## 2017-05-03 ENCOUNTER — Inpatient Hospital Stay (HOSPITAL_COMMUNITY): Payer: Medicare Other | Admitting: Anesthesiology

## 2017-05-03 ENCOUNTER — Encounter (HOSPITAL_COMMUNITY): Payer: Self-pay | Admitting: *Deleted

## 2017-05-03 DIAGNOSIS — O1494 Unspecified pre-eclampsia, complicating childbirth: Secondary | ICD-10-CM

## 2017-05-03 DIAGNOSIS — Z3A38 38 weeks gestation of pregnancy: Secondary | ICD-10-CM

## 2017-05-03 DIAGNOSIS — O24419 Gestational diabetes mellitus in pregnancy, unspecified control: Secondary | ICD-10-CM

## 2017-05-03 LAB — CBC
HCT: 36.4 % (ref 36.0–46.0)
Hemoglobin: 11.2 g/dL — ABNORMAL LOW (ref 12.0–15.0)
MCH: 25 pg — AB (ref 26.0–34.0)
MCHC: 30.8 g/dL (ref 30.0–36.0)
MCV: 81.3 fL (ref 78.0–100.0)
PLATELETS: 225 10*3/uL (ref 150–400)
RBC: 4.48 MIL/uL (ref 3.87–5.11)
RDW: 17.2 % — ABNORMAL HIGH (ref 11.5–15.5)
WBC: 11.2 10*3/uL — ABNORMAL HIGH (ref 4.0–10.5)

## 2017-05-03 LAB — GLUCOSE, CAPILLARY
GLUCOSE-CAPILLARY: 79 mg/dL (ref 65–99)
GLUCOSE-CAPILLARY: 96 mg/dL (ref 65–99)
GLUCOSE-CAPILLARY: 95 mg/dL (ref 65–99)
Glucose-Capillary: 109 mg/dL — ABNORMAL HIGH (ref 65–99)

## 2017-05-03 LAB — RPR: RPR Ser Ql: NONREACTIVE

## 2017-05-03 LAB — ABO/RH: ABO/RH(D): O POS

## 2017-05-03 MED ORDER — PHENYLEPHRINE 40 MCG/ML (10ML) SYRINGE FOR IV PUSH (FOR BLOOD PRESSURE SUPPORT)
80.0000 ug | PREFILLED_SYRINGE | INTRAVENOUS | Status: DC | PRN
  Filled 2017-05-03: qty 10
  Filled 2017-05-03: qty 5

## 2017-05-03 MED ORDER — SENNOSIDES-DOCUSATE SODIUM 8.6-50 MG PO TABS
2.0000 | ORAL_TABLET | ORAL | Status: DC
  Administered 2017-05-04 – 2017-05-05 (×2): 2 via ORAL
  Filled 2017-05-03 (×2): qty 2

## 2017-05-03 MED ORDER — SIMETHICONE 80 MG PO CHEW: 80.0000 mg | CHEWABLE_TABLET | ORAL | Status: DC | PRN

## 2017-05-03 MED ORDER — COCONUT OIL OIL: 1.0000 "application " | TOPICAL_OIL | Status: DC | PRN

## 2017-05-03 MED ORDER — EPHEDRINE 5 MG/ML INJ
10.0000 mg | INTRAVENOUS | Status: DC | PRN
  Filled 2017-05-03: qty 2

## 2017-05-03 MED ORDER — IBUPROFEN 600 MG PO TABS
600.0000 mg | ORAL_TABLET | Freq: Four times a day (QID) | ORAL | Status: DC
  Administered 2017-05-03 – 2017-05-05 (×8): 600 mg via ORAL
  Filled 2017-05-03 (×7): qty 1

## 2017-05-03 MED ORDER — TERBUTALINE SULFATE 1 MG/ML IJ SOLN: 0.2500 mg | Freq: Once | INTRAMUSCULAR | Status: DC | PRN

## 2017-05-03 MED ORDER — ACETAMINOPHEN 325 MG PO TABS: 650.0000 mg | ORAL_TABLET | ORAL | Status: DC | PRN

## 2017-05-03 MED ORDER — DIBUCAINE 1 % RE OINT: 1.0000 "application " | TOPICAL_OINTMENT | RECTAL | Status: DC | PRN

## 2017-05-03 MED ORDER — TETANUS-DIPHTH-ACELL PERTUSSIS 5-2.5-18.5 LF-MCG/0.5 IM SUSP: 0.5000 mL | Freq: Once | INTRAMUSCULAR | Status: DC

## 2017-05-03 MED ORDER — LACTATED RINGERS IV SOLN
500.0000 mL | Freq: Once | INTRAVENOUS | Status: AC
  Administered 2017-05-03: 250 mL via INTRAVENOUS

## 2017-05-03 MED ORDER — ZOLPIDEM TARTRATE 5 MG PO TABS: 5.0000 mg | ORAL_TABLET | Freq: Every evening | ORAL | Status: DC | PRN

## 2017-05-03 MED ORDER — FENTANYL 2.5 MCG/ML BUPIVACAINE 1/10 % EPIDURAL INFUSION (WH - ANES)
14.0000 mL/h | INTRAMUSCULAR | Status: DC | PRN
Start: 2017-05-03 — End: 2017-05-03
  Administered 2017-05-03: 14 mL/h via EPIDURAL
  Filled 2017-05-03: qty 100

## 2017-05-03 MED ORDER — WITCH HAZEL-GLYCERIN EX PADS: 1.0000 "application " | MEDICATED_PAD | CUTANEOUS | Status: DC | PRN

## 2017-05-03 MED ORDER — BENZOCAINE-MENTHOL 20-0.5 % EX AERO
1.0000 | INHALATION_SPRAY | CUTANEOUS | Status: DC | PRN
Start: 2017-05-03 — End: 2017-05-05

## 2017-05-03 MED ORDER — PRENATAL MULTIVITAMIN CH
1.0000 | ORAL_TABLET | Freq: Every day | ORAL | Status: DC
  Administered 2017-05-04 – 2017-05-05 (×2): 1 via ORAL
  Filled 2017-05-03 (×2): qty 1

## 2017-05-03 MED ORDER — DIPHENHYDRAMINE HCL 25 MG PO CAPS: 25.0000 mg | ORAL_CAPSULE | Freq: Four times a day (QID) | ORAL | Status: DC | PRN

## 2017-05-03 MED ORDER — ONDANSETRON HCL 4 MG PO TABS: 4.0000 mg | ORAL_TABLET | ORAL | Status: DC | PRN

## 2017-05-03 MED ORDER — LIDOCAINE HCL (PF) 1 % IJ SOLN
INTRAMUSCULAR | Status: DC | PRN
Start: 2017-05-03 — End: 2017-05-03
  Administered 2017-05-03 (×2): 5 mL via EPIDURAL

## 2017-05-03 MED ORDER — OXYTOCIN 40 UNITS IN LACTATED RINGERS INFUSION - SIMPLE MED
1.0000 m[IU]/min | INTRAVENOUS | Status: DC
  Administered 2017-05-03: 2 m[IU]/min via INTRAVENOUS
  Administered 2017-05-03: 16 m[IU]/min via INTRAVENOUS
  Filled 2017-05-03: qty 1000

## 2017-05-03 MED ORDER — DIPHENHYDRAMINE HCL 50 MG/ML IJ SOLN: 12.5000 mg | INTRAMUSCULAR | Status: DC | PRN

## 2017-05-03 MED ORDER — PHENYLEPHRINE 40 MCG/ML (10ML) SYRINGE FOR IV PUSH (FOR BLOOD PRESSURE SUPPORT)
80.0000 ug | PREFILLED_SYRINGE | INTRAVENOUS | Status: DC | PRN
  Filled 2017-05-03: qty 5

## 2017-05-03 NOTE — Progress Notes (Signed)
Ana Ellis is a 36 y.o. G5P4004 at 62107w4d admitted for induction of labor due to A1GDM and Pre-eclampsia with severe features.  Subjective: Patient more comfortable with epidural. No acute complaints. No headache, visual disturbances.   Objective: BP (!) 145/69   Pulse 83   Temp 98.1 F (36.7 C) (Oral)   Resp 18   Ht 5\' 1"  (1.549 m)   Wt 121.1 kg (267 lb)   LMP 07/27/2016 (Exact Date)   BMI 50.45 kg/m  I/O last 3 completed shifts: In: 2434.6 [P.O.:840; I.V.:1394.6; IV Piggyback:200] Out: 950 [Urine:950] Total I/O In: 2470 [P.O.:1200; I.V.:1070; IV Piggyback:200] Out: 1200 [Urine:1200]  FHT:  FHR: 140 bpm, variability: minimal ,  accelerations:  Present,  decelerations:  Present early, variable UC:   Irreq q2-5 mins SVE:   Dilation: 6.5 Effacement (%): 80 Station: -2, -3 Exam by:: middleton rn  Labs: Lab Results  Component Value Date   WBC 11.2 (H) 05/03/2017   HGB 11.2 (L) 05/03/2017   HCT 36.4 05/03/2017   MCV 81.3 05/03/2017   PLT 225 05/03/2017    Assessment / Plan: Induction of labor due to preeclampsia and gestational diabetes,  progressing well on pitocin  Labetolol dosed x2 total times now. Continue to monitor BP. Last BP 145/69.   Labor: Progressing well on pitocin Preeclampsia:  on magnesium sulfate Fetal Wellbeing:  Category I Pain Control:  Epidural I/D:  GBS pos - Vanc Anticipated MOD:  NSVD  Felisa BonierBianca Seivley PA Student  05/03/2017, 4:15 PM   I confirm that I have verified the information documented in the PA students's note and that I have also personally reperformed the physical exam and all medical decision making activities. Luna KitchensKathryn Kooistra CNM

## 2017-05-03 NOTE — Progress Notes (Signed)
LABOR PROGRESS NOTE  Ana Ellis is a 36 y.o. G5P4004 at 970w4d  Here for IOL for severe pre-eclampsia  Subjective:  Objective: BP (!) 128/111   Pulse 94   Temp 98 F (36.7 C) (Oral)   Resp 18   Ht 5\' 1"  (1.549 m)   Wt 267 lb (121.1 kg)   LMP 07/27/2016 (Exact Date)   BMI 50.45 kg/m  or  Vitals:   05/02/17 2248 05/03/17 0013 05/03/17 0108 05/03/17 0213  BP: 131/77 (!) 161/107 124/71 (!) 128/111  Pulse: 91 97 94 94  Resp:  16 18   Temp:    98 F (36.7 C)  TempSrc:    Oral  Weight:      Height:        Last SVE: Dilation: Closed Effacement (%): Thick Cervical Position: Posterior Station: Ballotable Presentation: Vertex Exam by:: Dr.Bland FHT: baseline rate 130, moderate varibility, +acel, no decel Toco: none  CBG (last 3)  Recent Labs    05/02/17 2044 05/03/17 0116  GLUCAP 83 79     Assessment / Plan: 36 y.o. G5P4004 at 7770w4d here for IOL for Severe pre-eclampsia  PreE w/ SF: on IV Mag. No signs/sx of toxicity. BPs improving Labor: cervical ripening with cytotec; will attempt FB with next check Fetal Wellbeing:  Cat I ID:  GBS +, Vanc due PCN allergy GDM: CBGs at goal LGA: shoulder dystocia precautions Anticipated MOD:  SVD  Frederik PearJulie P Val Farnam, MD 05/03/2017, 2:38 AM

## 2017-05-03 NOTE — Progress Notes (Signed)
Ana Ellis is a 36 y.o. G5P4004 at 9031w4d admitted for induction of labor due to severe pre-eclampsia.  Subjective: Patient is comfortable with IV pain medication. No headache, visual changes, or other acute complaints at this time.   Objective: BP (!) 145/73   Pulse 88   Temp 97.9 F (36.6 C) (Oral)   Resp 18   Ht 5\' 1"  (1.549 m)   Wt 121.1 kg (267 lb)   LMP 07/27/2016 (Exact Date)   BMI 50.45 kg/m  I/O last 3 completed shifts: In: 2434.6 [P.O.:840; I.V.:1394.6; IV Piggyback:200] Out: 950 [Urine:950] Total I/O In: 1490 [P.O.:720; I.V.:570; IV Piggyback:200] Out: 600 [Urine:600]  FHT:  FHR: 135 bpm, variability: minimal ,  accelerations:  Abscent,  decelerations:  Absent UC:   Irregular q3-6 minutes  SVE:   Dilation: 5.5 Effacement (%): 80 Station: Ballotable Exam by:: [employer]iddleton RN  Labs: Lab Results  Component Value Date   WBC 9.9 05/02/2017   HGB 11.4 (L) 05/02/2017   HCT 36.6 05/02/2017   MCV 81.0 05/02/2017   PLT 238 05/02/2017   Assessment / Plan: Induction of labor due to preeclampsia,  progressing well on pitocin  Labor: Induction with Pitocin. Augmented with FB now out and cytotecx2 Preeclampsia:  on magnesium sulfate with no recent severe range BP. Most recent BP 145/73.   Fetal Wellbeing:  Category I Pain Control:  IV pain meds I/D:  Vanc in labor Anticipated MOD:  NSVD  Felisa BonierBianca Seivley PA Student 05/03/2017, 11:39 AM   I confirm that I have verified the information documented in the student's note and that I have also personally reperformed the physical exam and all medical decision making activities.  Luna KitchensKathryn Eagan Shifflett CNM

## 2017-05-03 NOTE — Anesthesia Procedure Notes (Signed)
Epidural Patient location during procedure: OB Start time: 05/03/2017 3:00 PM End time: 05/03/2017 3:15 PM  Staffing Anesthesiologist: Bethena Midgetddono, Dorota Heinrichs, MD  Preanesthetic Checklist Completed: patient identified, site marked, surgical consent, pre-op evaluation, timeout performed, IV checked, risks and benefits discussed and monitors and equipment checked  Epidural Patient position: sitting Prep: site prepped and draped and DuraPrep Patient monitoring: continuous pulse ox and blood pressure Approach: midline Location: L3-L4 Injection technique: LOR air  Needle:  Needle type: Tuohy  Needle gauge: 17 G Needle length: 9 cm and 9 Needle insertion depth: 8 cm Catheter type: closed end flexible Catheter size: 19 Gauge Catheter at skin depth: 14 cm Test dose: negative  Assessment Events: blood not aspirated, injection not painful, no injection resistance, negative IV test and no paresthesia

## 2017-05-03 NOTE — Progress Notes (Signed)
Patient Ana Ellis is a 36 y.o. G5P4004 At 1012w4d Patient now 10 cm; will try pushing with the RN.   -Patient has not required any IV medications for BP since 1145 am.  -FHR is baseline 140, no acel, minimal variability, occasional variable, contractions 1-4 min.  Anticipate imminent NSVD.   Ana Ellis CNM

## 2017-05-03 NOTE — Anesthesia Preprocedure Evaluation (Signed)
Anesthesia Evaluation  Patient identified by MRN, date of birth, ID band Patient awake    Reviewed: Allergy & Precautions, H&P , NPO status , Patient's Chart, lab work & pertinent test results, reviewed documented beta blocker date and time   Airway Mallampati: III  TM Distance: >3 FB Neck ROM: full    Dental no notable dental hx.    Pulmonary neg pulmonary ROS,    Pulmonary exam normal breath sounds clear to auscultation       Cardiovascular hypertension, negative cardio ROS Normal cardiovascular exam Rhythm:regular Rate:Normal     Neuro/Psych negative neurological ROS  negative psych ROS   GI/Hepatic negative GI ROS, Neg liver ROS,   Endo/Other  negative endocrine ROSdiabetes  Renal/GU negative Renal ROS  negative genitourinary   Musculoskeletal   Abdominal   Peds  Hematology negative hematology ROS (+)   Anesthesia Other Findings   Reproductive/Obstetrics (+) Pregnancy                             Anesthesia Physical Anesthesia Plan  ASA: III  Anesthesia Plan: Epidural   Post-op Pain Management:    Induction:   PONV Risk Score and Plan:   Airway Management Planned:   Additional Equipment:   Intra-op Plan:   Post-operative Plan:   Informed Consent: I have reviewed the patients History and Physical, chart, labs and discussed the procedure including the risks, benefits and alternatives for the proposed anesthesia with the patient or authorized representative who has indicated his/her understanding and acceptance.     Plan Discussed with:   Anesthesia Plan Comments:         Anesthesia Quick Evaluation

## 2017-05-03 NOTE — Progress Notes (Addendum)
   Ana Ellis is a 36 y.o. G5P4004 at 4566w4d  admitted for induction of labor due to [facility]MA[facility] and pre-e with severe features.   Subjective: Patient is doing well; no headaches, blurry vision or visual changes.   Objective: Vitals:   05/03/17 1100 05/03/17 1132 05/03/17 1146 05/03/17 1200  BP: (!) 145/73 (!) 175/86 (!) 180/86 124/69  Pulse: 88 93 92 82  Resp: 18     Temp:      TempSrc:      Weight:      Height:       Total I/O In: 1490 [P.O.:720; I.V.:570; IV Piggyback:200] Out: 600 [Urine:600]  FHT:  FHR: 135 bpm, variability: moderate,  accelerations:  Abscent,  decelerations:  Absent UC:   irregular, every 2-7 minutes SVE:   Dilation: 5.5 Effacement (%): 80 Station: Ballotable Exam by:: [employer]Middleton RN Pitocin @ 12 mu/min  Patient received IV labetalol at 1147 for elevated BP.   Blood sugar has been well-controlled.   Labs: Lab Results  Component Value Date   WBC 9.9 05/02/2017   HGB 11.4 (L) 05/02/2017   HCT 36.6 05/02/2017   MCV 81.0 05/02/2017   PLT 238 05/02/2017    Assessment / Plan: Induction of labor due to GDMA1 and pre e with severe features. ,  progressing well on pitocin   Labor: Progressing on Pitocin, will continue to increase then AROM Fetal Wellbeing:  Category I Pain Control:  IV pain meds Anticipated MOD:  NSVD  Charlesetta GaribaldiKathryn Lorraine Kooistra 05/03/2017, 12:01 PM

## 2017-05-03 NOTE — Anesthesia Postprocedure Evaluation (Signed)
Anesthesia Post Note  Patient: Ana Ellis  Procedure(s) Performed: AN AD HOC LABOR EPIDURAL     Patient location during evaluation: Mother Baby Anesthesia Type: Epidural Level of consciousness: awake and alert Pain management: pain level controlled Vital Signs Assessment: post-procedure vital signs reviewed and stable Respiratory status: spontaneous breathing, nonlabored ventilation and respiratory function stable Cardiovascular status: stable Postop Assessment: no headache, no backache and epidural receding Anesthetic complications: no    Last Vitals:  Vitals:   05/03/17 1731 05/03/17 1745  BP: (!) 148/73 (!) 154/73  Pulse: (!) 102 96  Resp:    Temp: 36.7 C     Last Pain:  Vitals:   05/03/17 1731  TempSrc: Oral  PainSc:    Pain Goal: Patients Stated Pain Goal: 3 (05/03/17 1200)               English [place]

## 2017-05-03 NOTE — Progress Notes (Signed)
SVE 1cm/30%/bollatoble  FHT Cat I  FB placed, cytotec #2  Ana FanningJulie P. Chandi Nicklin, MD OB Fellow

## 2017-05-04 ENCOUNTER — Other Ambulatory Visit: Payer: Self-pay

## 2017-05-04 LAB — CBC
HCT: 34.1 % — ABNORMAL LOW (ref 36.0–46.0)
HEMOGLOBIN: 10.5 g/dL — AB (ref 12.0–15.0)
MCH: 25 pg — AB (ref 26.0–34.0)
MCHC: 30.8 g/dL (ref 30.0–36.0)
MCV: 81.2 fL (ref 78.0–100.0)
Platelets: 232 10*3/uL (ref 150–400)
RBC: 4.2 MIL/uL (ref 3.87–5.11)
RDW: 17.2 % — ABNORMAL HIGH (ref 11.5–15.5)
WBC: 9.6 10*3/uL (ref 4.0–10.5)

## 2017-05-04 NOTE — Addendum Note (Signed)
Addendum  created 05/04/17 [id number] by Blaike Newburn, Doree Fudgeolleen S, CRNA   Charge Capture section accepted, Sign clinical note, Visit diagnoses modified

## 2017-05-04 NOTE — Anesthesia Postprocedure Evaluation (Signed)
Anesthesia Post Note  Patient: Mychael Neyland  Procedure(s) Performed: AN AD HOC LABOR EPIDURAL     Patient location during evaluation: [facility] Anesthesia Type: Epidural Level of consciousness: awake, awake and alert and oriented Pain management: pain level controlled Vital Signs Assessment: post-procedure vital signs reviewed and stable Respiratory status: spontaneous breathing, nonlabored ventilation and respiratory function stable Cardiovascular status: stable Postop Assessment: no headache, no backache, patient able to bend at knees, no apparent nausea or vomiting and adequate PO intake Anesthetic complications: no    Last Vitals:  Vitals:   05/04/17 0427 05/04/17 0800  BP: 114/61 (!) 126/52  Pulse: 90 89  Resp: 18 18  Temp: 36.6 C 36.6 C  SpO2: 98% 99%    Last Pain:  Vitals:   05/04/17 0809  TempSrc:   PainSc: 0-No pain   Pain Goal: Patients Stated Pain Goal: 3 (05/04/17 0809)               Payslie Mccaig

## 2017-05-04 NOTE — Lactation Note (Signed)
This note was copied from a baby's chart. Lactation Consultation Note  Patient Name: Girl Valta Sadie HaberKropira Today's Date: 05/04/2017 Reason for consult: Initial assessment;Breast reduction Mom states that she has had to supplement previous babies due to breast reduction but her supply has increased with each baby.  Newborn is 1822 hours old and she has been very sleepy.  Attempted to latch baby to breast but baby very sleepy and not showing interest in feeding.  Hand expression done but not colostrum seen.  Mom reports seeing colostrum earlier.  Symphony pump set up and initiated.  Instructed to pump every 3 hours.  Mom will continue to attempt to latch baby with feeding cues and call for assist prn.  Maternal Data Has patient been taught Hand Expression?: Yes Does the patient have breastfeeding experience prior to this delivery?: Yes  Feeding Feeding Type: Breast Fed  LATCH Score Latch: Too sleepy or reluctant, no latch achieved, no sucking elicited.  Audible Swallowing: None  Type of Nipple: Everted at rest and after stimulation  Comfort (Breast/Nipple): Soft / non-tender  Hold (Positioning): Assistance needed to correctly position infant at breast and maintain latch.  LATCH Score: 5  Interventions Interventions: Breast feeding basics reviewed;Skin to skin;Hand express  Lactation Tools Discussed/Used Pump Review: Setup, frequency, and cleaning;Milk Storage Initiated by:: LM Date initiated:: 05/04/17   Consult Status Consult Status: Follow-up Date: 05/05/17 Follow-up type: In-patient    Huston FoleyMOULDEN, Buelah Rennie S 05/04/2017, 4:04 PM

## 2017-05-05 MED ORDER — IBUPROFEN 600 MG PO TABS: 600.0000 mg | ORAL_TABLET | Freq: Four times a day (QID) | ORAL | 0 refills | Status: AC

## 2017-05-05 MED ORDER — IBUPROFEN 600 MG PO TABS: 600.0000 mg | ORAL_TABLET | Freq: Four times a day (QID) | ORAL | 0 refills | Status: DC

## 2017-05-05 NOTE — Progress Notes (Signed)
Pt ambulated out teaching complete  

## 2017-05-05 NOTE — Lactation Note (Signed)
This note was copied from a baby's chart. Lactation Consultation Note  Patient Name: Ana Ellis Today's Date: 05/05/2017 Reason for consult: Follow-up assessment Baby at 43 hr of life with increased bili. Mom reports she has used the DEBP x2 in 43 hr. She was "not getting anything so I just haven't done it anymore". She stated she has been trying to latch baby but does not think baby "gets anything". Reviewed the importance of stimulation. Discussed baby behavior, feeding frequency, pumping, supplementing volume guidelines, baby belly size, voids, wt loss, breast changes, and nipple care. Mom is aware of lactation services and support group. She will call as needed.    Maternal Data    Feeding Feeding Type: Formula Nipple Type: Slow - flow  LATCH Score                   Interventions    Lactation Tools Discussed/Used     Consult Status Consult Status: PRN    Rulon Eisenmengerlizabeth E Seda Kronberg 05/05/2017, 12:13 PM

## 2017-05-05 NOTE — Discharge Summary (Signed)
OB Discharge Summary     Patient Name: Ana Ellis DOB: Jan 19, 1981 MRN: [id number]  Date of admission: 05/02/2017 Delivering MD: Marylene LandKOOISTRA, KATHRYN LORRAINE   Date of discharge: 05/05/2017  Admitting diagnosis: 38wks Induction Intrauterine pregnancy: 713w4d     Secondary diagnosis:  Active Problems:   Obesity in pregnancy   AMA (advanced maternal age) multigravida 35+  Additional problems: gest DM A1 diet controlled     Discharge diagnosis: Term Pregnancy Delivered and Preeclampsia (severe)                                                                                                Post partum procedures:mag sulfate x 24 h  Augmentation: Pitocin, Cytotec and Foley Balloon  Complications: None  Hospital course:  Induction of Labor With Vaginal Delivery   36 y.o. yo G5P5005 at 5713w4d was admitted to the hospital 05/02/2017 for induction of labor.  Indication for induction: Gestational hypertension and A1 DM  LABOR AND DELIVERY ADMISSION HISTORY AND PHYSICAL NOTE  Ana Ellis is a 36 y.o. female 625P4004 with IUP at 4058w3d by US presenting for IOL 2/2 gestational HTN. She has had a headache all day, but has not taken anything for this. Denies RUQ/epigastric pain, visual disturbances or SOB.   She reports positive fetal movement. She denies leakage of fluid or vaginal bleeding.  [facility] P[facility] complications:  - Gestational HTN, GDM, GBS pos Physical Exam Blood pressure (!) 158/93, pulse (!) 129, temperature 97.9 F (36.6 C), temperature source Oral, resp. rate 16, height 5\' 1"  (1.549 m), weight 121.1 kg (267 lb), last menstrual period 07/27/2016. Prenatal labs: ABO, Rh: O/POS/-- (05/17 1455) Antibody: NEG (05/17 1455) Rubella: 2.48 (05/17 1455) RPR: NON-REACTIVE (10/22 0841)  HBsAg: NEGATIVE (05/17 1455)  HIV: NON-REACTIVE (10/22 0841)  GC/Chlamydia: neg GBS: Negative (12/07 0000)  1 hr Glucola: 83/204/58, GDM Genetic  screening:  declined Anatomy US: normal female         .  Patient had an uncomplicated labor course as follows:cytotec induction . Foley bulb used. Then Pitocin. Membrane Rupture Time/Date: 12:24 PM ,05/03/2017   Intrapartum Procedures: Episiotomy: None [1]                                         Lacerations:  None [1]  Patient had delivery of a Viable infant.  Information for the patient's newborn:  Ana Ellis, Girl Ana Ellis [[id number]][[id number]]  Delivery Method: Vag-Spont   05/03/2017  Details of delivery can be found in separate delivery note .Delivery Note 36 y.o G4P4004 at 8013w4d with gDM induced with Cytotec, FB, and pitocin for preeclampsia on MgSO4.   At 5:11 PM a viable female was delivered via Vaginal, Spontaneous (Presentation: vertex, OA with rotation to LOA).  APGAR: 7, 9; weight: pending  .   Placenta status: intact, schultz Cord: 3 vessels  with the following complications: none .  Cord pH: n/a  Anesthesia:  Epidural Episiotomy: None Lacerations: None Suture Repair: n/a Est. Blood Loss (mL):  150mL   Mom to postpartum.  Baby to Couplet care / Skin to Skin.  Ana BonierBianca Seivley PA Student 05/03/2017, 5:30 PM     Patient had a postpartum course notable for Mag sulfate x 24 hr, . Patient is discharged home 05/05/17.with normal blood pressures.  Physical exam  Vitals:   05/04/17 2320 05/05/17 0303 05/05/17 0500 05/05/17 0800  BP: 98/80 123/72  117/72  Pulse: 97 99  88  Resp: 17 18  18   Temp: 98.5 F (36.9 C) 98.6 F (37 C)  97.8 F (36.6 C)  TempSrc: Oral Oral  Oral  SpO2: 100% 99%  98%  Weight:   262 lb 0.8 oz (118.9 kg)   Height:       General: alert, cooperative and no distress Lochia: appropriate Uterine Fundus: firm t u-2 Incision:  DVT Evaluation: No evidence of DVT seen on physical exam. Labs: Lab Results  Component Value Date   WBC 9.6 05/04/2017   HGB 10.5 (L) 05/04/2017   HCT 34.1 (L) 05/04/2017   MCV 81.2 05/04/2017   PLT 232 05/04/2017   CMP  Latest Ref Rng & Units 05/02/2017  Glucose 65 - 99 mg/dL 119(J101(H)  BUN 6 - 20 mg/dL 10  Creatinine 4.780.44 - 2.951.00 mg/dL 6.210.46  Sodium 308135 - 657145 mmol/L 134(L)  Potassium 3.5 - 5.1 mmol/L 4.0  Chloride 101 - 111 mmol/L 102  CO2 22 - 32 mmol/L 21(L)  Calcium 8.9 - 10.3 mg/dL 8.4(O8.8(L)  Total Protein 6.5 - 8.1 g/dL 9.6(E5.8(L)  Total Bilirubin 0.3 - 1.2 mg/dL 0.3  Alkaline Phos 38 - 126 U/L 255(H)  AST 15 - 41 U/L 18  ALT 14 - 54 U/L 10(L)    Discharge instruction: per After Visit Summary and "Baby and Me Booklet".  After visit meds:  Allergies as of 05/05/2017      Reactions   Penicillins Hives, Shortness Of Breath, Nausea Only   Has patient had a PCN reaction causing immediate rash, facial/tongue/throat swelling, SOB or lightheadedness with hypotension: No Has patient had a PCN reaction causing severe rash involving mucus membranes or skin necrosis: No Has patient had a PCN reaction that required hospitalization: No Has patient had a PCN reaction occurring within the last 10 years: No If all of the above answers are "NO", then may proceed with Cephalosporin use.      Medication List    STOP taking these medications   ACCU-CHEK AVIVA device   ACCU-CHEK FASTCLIX LANCETS Misc   glucose blood test strip     TAKE these medications   ibuprofen 600 MG tablet Commonly known as:  ADVIL,MOTRIN Take 1 tablet (600 mg total) by mouth every 6 (six) hours.   multivitamin-prenatal 27-0.8 MG Tabs tablet Take 1 tablet by mouth daily at 12 noon.       Diet: carb modified diet  Activity: Advance as tolerated. Pelvic rest for 6 weeks.   Outpatient follow up:6 weeks Follow up Appt: Future Appointments  Date Time Provider Department Center  06/13/2017  3:30 PM Conan Bowensavis, Kelly M, MD [facility]   Follow up Visit:No Follow-up on file.  Postpartum contraception: IUD Mirena  Newborn Data: Live born female  Birth Weight: 8 lb 0.8 oz (3650 g) APGAR: 7, 9  Newborn Delivery   Birth  date/time:  05/03/2017 17:11:00 Delivery type:  Vaginal, Spontaneous     Baby Feeding: Bottle and Breast Disposition:rooming in   05/05/2017 Tilda BurrowJohn V Colan Laymon, MD

## 2017-06-13 ENCOUNTER — Ambulatory Visit: Payer: Medicare Other | Admitting: Obstetrics and Gynecology

## 2017-06-17 ENCOUNTER — Ambulatory Visit: Payer: Medicare Other | Admitting: Advanced Practice Midwife

## 2017-11-04 IMAGING — US US MFM OB DETAIL+14 WK
1 series · 14 of 28 positions shown · non-contrast
Comparison: none

[Series 1: us mfm ob detail+14 wk · 68 acquisitions, 14 frames shown]
[im 3/68]
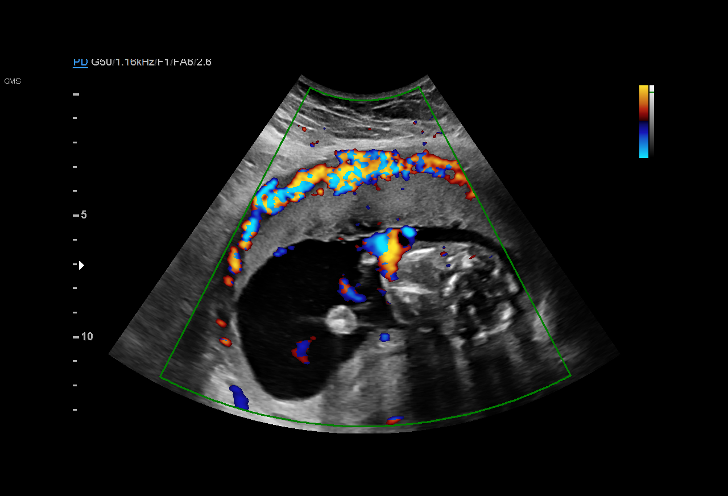
[im 8/68]
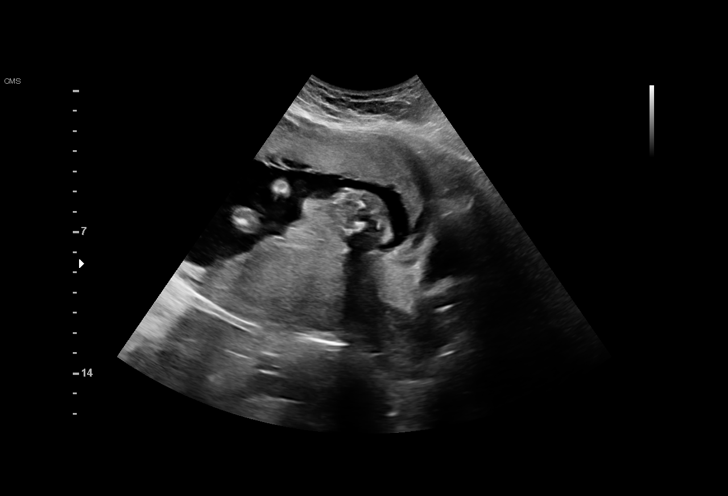
[im 13/68]
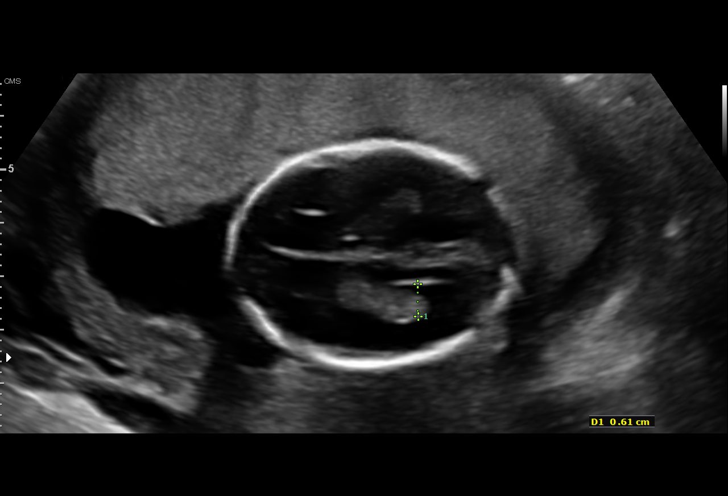
[im 18/68]
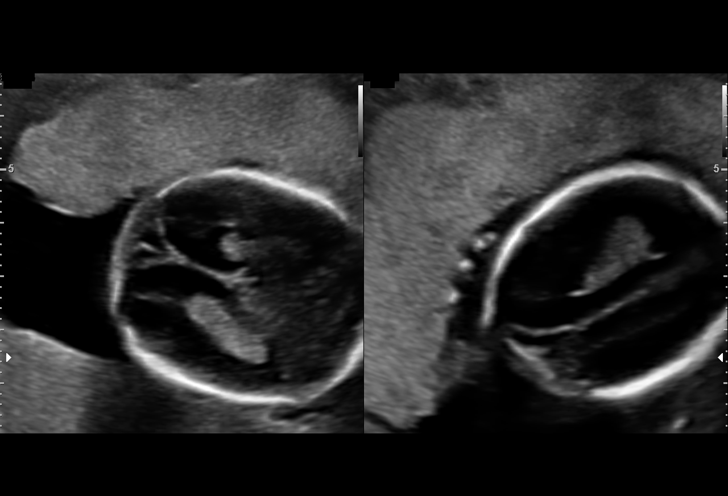
[im 23/68]
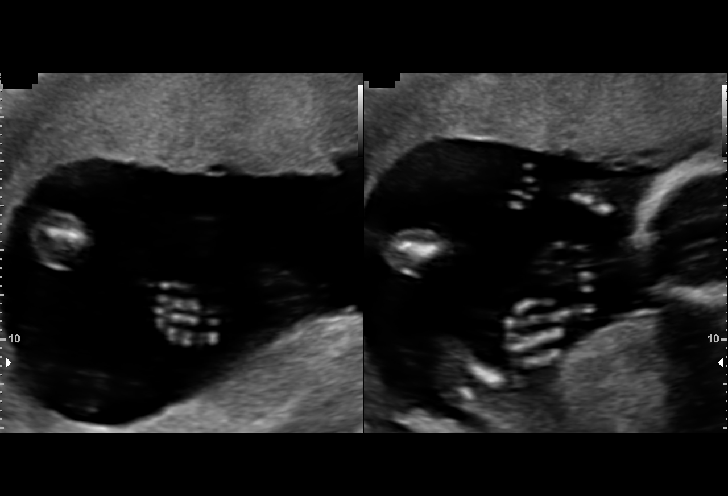
[im 28/68]
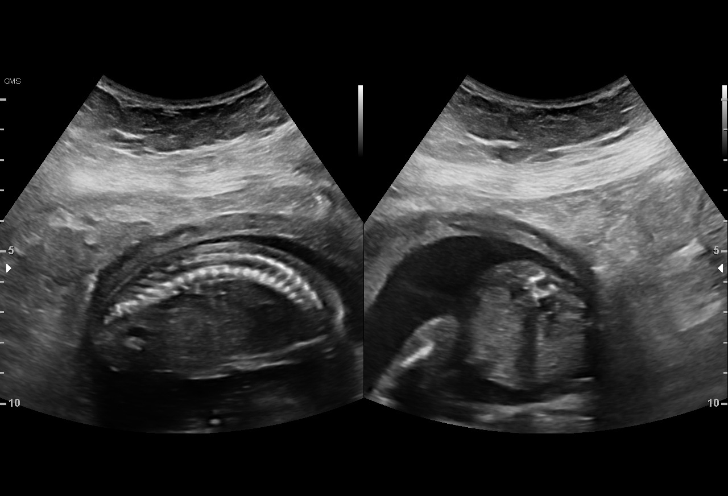
[im 33/68]
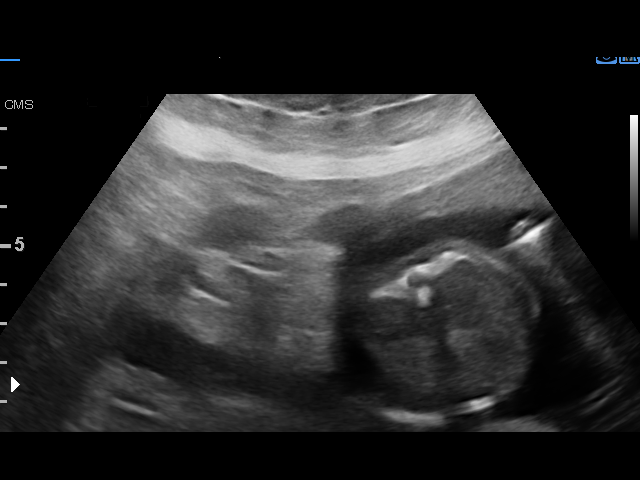
[im 38/68]
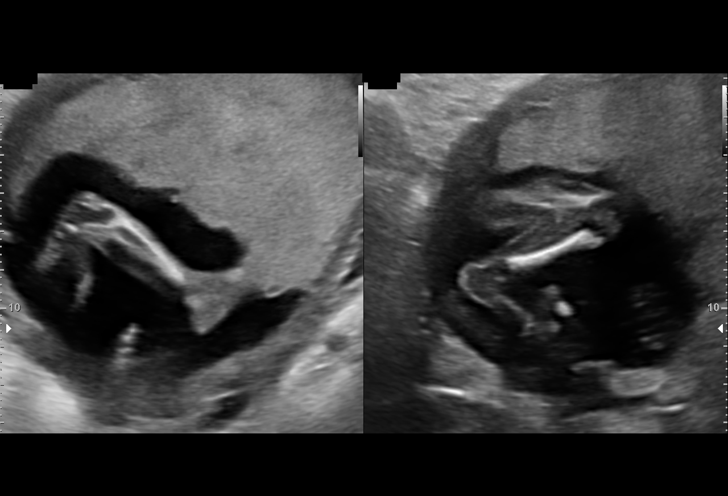
[im 43/68]
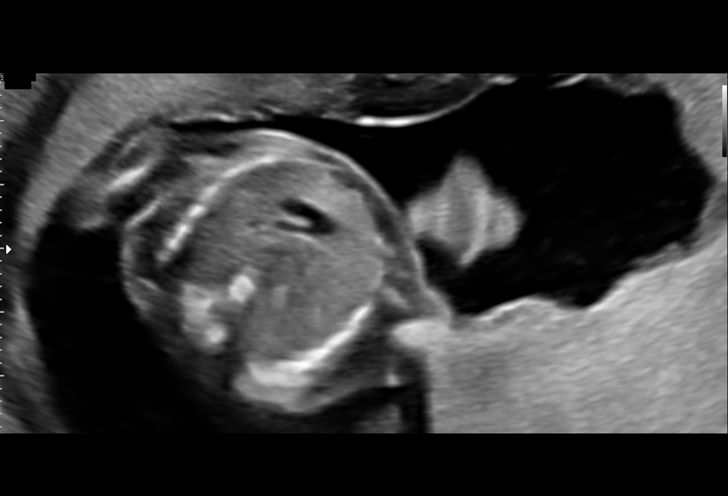
[im 48/68]
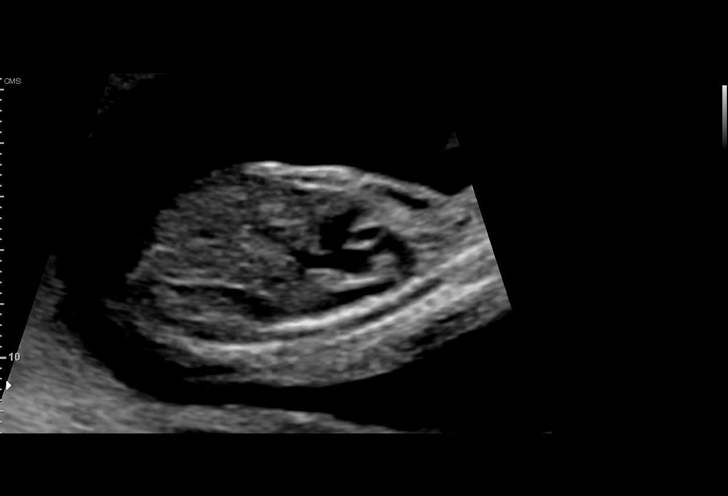
[im 53/68]
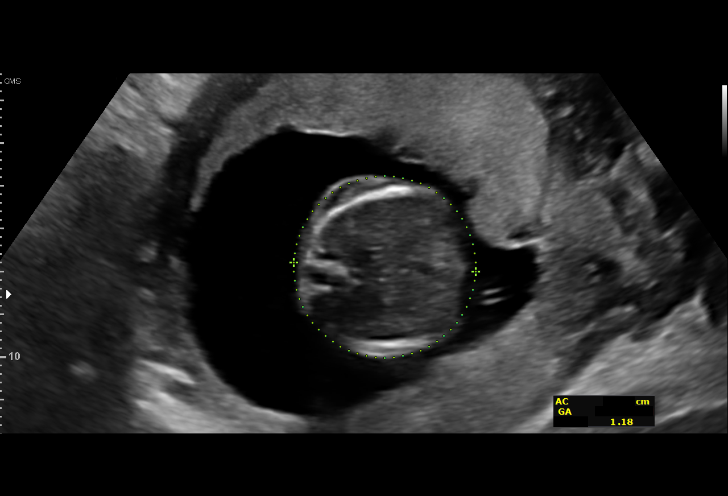
[im 58/68]
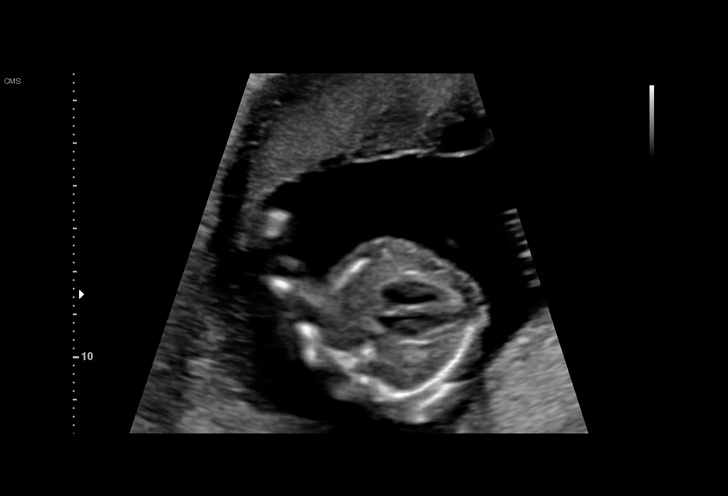
[im 63/68]
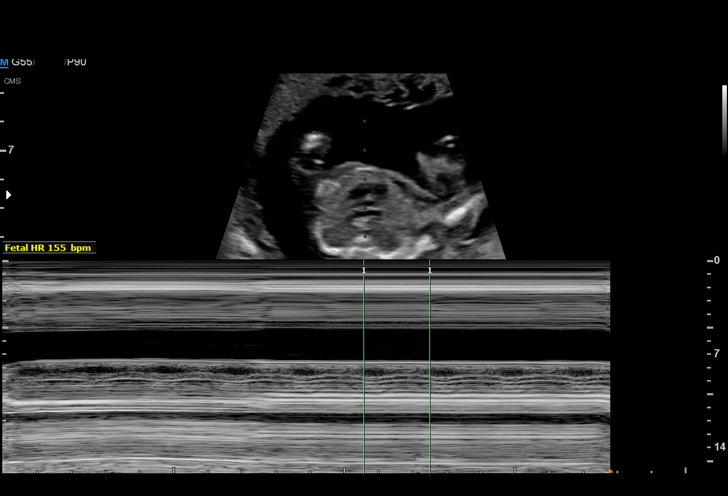
[im 68/68]
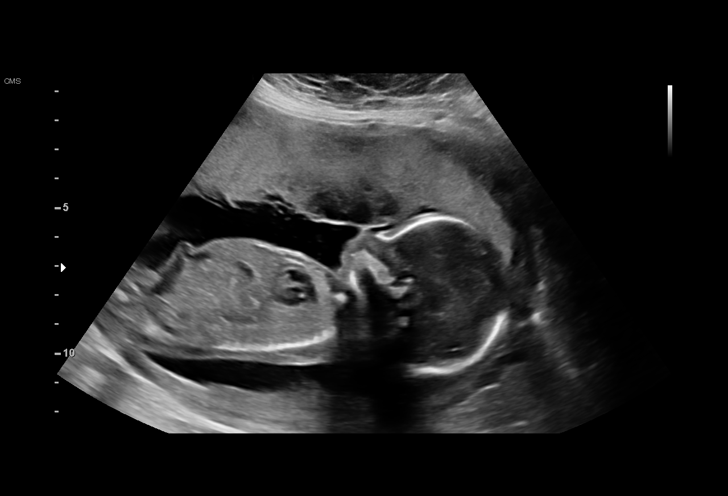

[14 of 28 positions shown; findings below may reference images not displayed]

1  LATONYA GLASSER            [id number]      [id number]     [id number]
Indications

18 weeks gestation of pregnancy
Encounter for fetal anatomic survey
Obesity complicating pregnancy, second
trimester
Advanced maternal age multigravida 35+,
second trimester (declined screening)
OB History

Gravidity:    5         Term:   4        Prem:   0        SAB:   0
TOP:          0       Ectopic:  0        Living: 4
Fetal Evaluation

Num Of Fetuses:     1
Fetal Heart         155
Rate(bpm):
Cardiac Activity:   Observed
Presentation:       Variable
Placenta:           Anterior, above cervical os
P. Cord Insertion:  Visualized

Amniotic Fluid
AFI FV:      Subjectively within normal limits

Largest Pocket(cm)
5.76
Biometry

BPD:        41  mm     G. Age:  18w 3d         44  %    CI:        68.68   %    70 - 86
FL/HC:      17.3   %    16.1 -
HC:      158.1  mm     G. Age:  18w 5d         48  %    HC/AC:      1.19        1.09 -
AC:      133.1  mm     G. Age:  18w 6d         54  %    FL/BPD:     66.6   %
FL:       27.3  mm     G. Age:  18w 2d         36  %    FL/AC:      20.5   %    20 - 24
HUM:      27.6  mm     G. Age:  18w 6d         60  %
CER:      19.5  mm     G. Age:  18w 5d         57  %
NFT:       4.4  mm
CM:          5  mm

Est. FW:     247  gm      0 lb 9 oz     46  %
Gestational Age

LMP:           20w 0d        Date:  07/27/16                 EDD:   05/03/17
U/S Today:     18w 4d                                        EDD:   05/13/17
Best:          18w 4d     Det. By:  U/S (12/14/16)           EDD:   05/13/17
Anatomy

Cranium:               Appears normal         Aortic Arch:            Appears normal
Cavum:                 Appears normal         Ductal Arch:            Appears normal
Ventricles:            Appears normal         Diaphragm:              Appears normal
Choroid Plexus:        Appears normal         Stomach:                Appears normal, left
sided
Cerebellum:            Appears normal         Abdomen:                Appears normal
Posterior Fossa:       Appears normal         Abdominal Wall:         Appears nml (cord
insert, abd wall)
Nuchal Fold:           Appears normal         Cord Vessels:           Appears normal (3
vessel cord)
Face:                  Appears normal         Kidneys:                Appear normal
(orbits and profile)
Lips:                  Appears normal         Bladder:                Appears normal
Thoracic:              Appears normal         Spine:                  Appears normal
Heart:                 Appears normal         Upper Extremities:      Appears normal
(4CH, axis, and
situs)
RVOT:                  Appears normal         Lower Extremities:      Appears normal
LVOT:                  Appears normal

Other:  Fetus appears to be a female. Heels and 5th digit visualized. Nasal
bone visualized. Technically difficult due to maternal habitus.
Cervix Uterus Adnexa

Cervix
Length:            3.5  cm.
Normal appearance by transabdominal scan.

Uterus
No abnormality visualized.

Left Ovary
Within normal limits.

Right Ovary
Not visualized.

Cul De Sac:   No free fluid seen.

Adnexa:       No abnormality visualized.
Impression

Singleton intrauterine pregnancy at 18+4 weeks with AMA
Review of the anatomy shows no sonographic markers for
aneuploidy or structural anomalies
Amniotic fluid volume is normal
Estimated fetal weight is 247g which is growth in the 46th
percentile
Recommendations

Follow-up ultrasounds as clinically indicated.
